# Patient Record
Sex: Male | Born: 1935
Health system: Southern US, Community
[De-identification: ages and names within clinical notes are randomized; demographics above are authoritative.]

## PROBLEM LIST (undated history)

## (undated) DIAGNOSIS — I10 Essential (primary) hypertension: Secondary | ICD-10-CM

## (undated) DIAGNOSIS — E119 Type 2 diabetes mellitus without complications: Secondary | ICD-10-CM

---

## 2009-08-15 ENCOUNTER — Ambulatory Visit: Payer: Self-pay | Admitting: Internal Medicine

## 2012-12-05 LAB — URINALYSIS, COMPLETE
Hyaline Cast: 2
Nitrite: NEGATIVE
Ph: 5 (ref 4.5–8.0)
Protein: NEGATIVE
RBC,UR: 1 /HPF (ref 0–5)
Squamous Epithelial: 1

## 2012-12-05 LAB — PROTIME-INR
INR: 1.1
Prothrombin Time: 14.8 secs — ABNORMAL HIGH (ref 11.5–14.7)

## 2012-12-05 LAB — APTT: Activated PTT: 31.1 secs (ref 23.6–35.9)

## 2012-12-05 LAB — CBC
MCH: 30.6 pg (ref 26.0–34.0)
MCHC: 33.7 g/dL (ref 32.0–36.0)
Platelet: 166 10*3/uL (ref 150–440)
RBC: 2.65 10*6/uL — ABNORMAL LOW (ref 4.40–5.90)
RDW: 14.5 % (ref 11.5–14.5)

## 2012-12-05 LAB — BASIC METABOLIC PANEL
Anion Gap: 2 — ABNORMAL LOW (ref 7–16)
BUN: 67 mg/dL — ABNORMAL HIGH (ref 7–18)
Chloride: 110 mmol/L — ABNORMAL HIGH (ref 98–107)
EGFR (African American): 19 — ABNORMAL LOW
Glucose: 166 mg/dL — ABNORMAL HIGH (ref 65–99)
Osmolality: 297 (ref 275–301)
Sodium: 137 mmol/L (ref 136–145)

## 2012-12-06 ENCOUNTER — Inpatient Hospital Stay: Payer: Self-pay | Admitting: Internal Medicine

## 2012-12-06 LAB — BASIC METABOLIC PANEL WITH GFR
Anion Gap: 4 — ABNORMAL LOW
BUN: 58 mg/dL — ABNORMAL HIGH
Calcium, Total: 8.1 mg/dL — ABNORMAL LOW
Chloride: 113 mmol/L — ABNORMAL HIGH
Co2: 24 mmol/L
Creatinine: 2.39 mg/dL — ABNORMAL HIGH
EGFR (African American): 27 — ABNORMAL LOW
EGFR (Non-African Amer.): 23 — ABNORMAL LOW
Glucose: 146 mg/dL — ABNORMAL HIGH
Osmolality: 300
Potassium: 5.1 mmol/L
Sodium: 141 mmol/L

## 2012-12-06 LAB — CBC
HCT: 25.3 % — ABNORMAL LOW
HGB: 8.6 g/dL — ABNORMAL LOW
MCH: 30.8 pg
MCHC: 33.9 g/dL
MCV: 91 fL
Platelet: 140 x10 3/mm 3 — ABNORMAL LOW
RBC: 2.79 x10 6/mm 3 — ABNORMAL LOW
RDW: 13.9 %
WBC: 9.2 x10 3/mm 3

## 2012-12-06 LAB — HEMATOCRIT
HCT: 26 % — ABNORMAL LOW
HCT: 24.7 % — ABNORMAL LOW (ref 40.0–52.0)

## 2012-12-06 LAB — HEMOGLOBIN
HGB: 8.9 g/dL — ABNORMAL LOW
HGB: 8.5 g/dL — ABNORMAL LOW (ref 13.0–18.0)

## 2012-12-07 LAB — BASIC METABOLIC PANEL
Anion Gap: 2 — ABNORMAL LOW (ref 7–16)
Chloride: 113 mmol/L — ABNORMAL HIGH (ref 98–107)
Co2: 26 mmol/L (ref 21–32)
EGFR (African American): 32 — ABNORMAL LOW
Glucose: 128 mg/dL — ABNORMAL HIGH (ref 65–99)
Osmolality: 294 (ref 275–301)

## 2012-12-07 LAB — MAGNESIUM: Magnesium: 1.5 mg/dL — ABNORMAL LOW

## 2012-12-08 LAB — COMPREHENSIVE METABOLIC PANEL
Albumin: 2.6 g/dL — ABNORMAL LOW (ref 3.4–5.0)
Co2: 25 mmol/L (ref 21–32)
EGFR (African American): 30 — ABNORMAL LOW
EGFR (Non-African Amer.): 26 — ABNORMAL LOW
Osmolality: 290 (ref 275–301)
SGPT (ALT): 13 U/L (ref 12–78)

## 2012-12-08 LAB — HEMOGLOBIN: HGB: 7.8 g/dL — ABNORMAL LOW (ref 13.0–18.0)

## 2013-05-15 ENCOUNTER — Other Ambulatory Visit: Payer: Self-pay | Admitting: Orthopedic Surgery

## 2013-05-15 LAB — BODY FLUID CELL COUNT WITH DIFFERENTIAL
Basophil: 0 %
Eosinophil: 0 %
LYMPHS PCT: 3 %
NUCLEATED CELL COUNT: 28922 /mm3
Neutrophils: 95 %
OTHER CELLS BF: 0 %
Other Mononuclear Cells: 2 %

## 2013-05-15 LAB — SYNOVIAL FLUID, CRYSTAL: CRYSTALS, JOINT FLUID: NONE SEEN

## 2013-05-19 LAB — BODY FLUID CULTURE

## 2014-04-21 ENCOUNTER — Emergency Department: Payer: Self-pay | Admitting: Family Medicine

## 2014-05-25 NOTE — H&P (Signed)
PATIENT NAME:  Jared Patterson, Jared Patterson MR#:  [id number] DATE OF BIRTH:  [birth year]  DATE OF ADMISSION:  12/06/2012  PRIMARY CARE PHYSICIAN: At the [place].   REFERRING PHYSICIAN: Dr. Fanny Bien  CHIEF COMPLAINT:  Dark-colored vomit and black tarry stool.   HISTORY OF PRESENT ILLNESS: The patient is a 81 year old pleasant African American male with past medical history of colon cancer, status post partial colon resection, hypertension, hyperlipidemia, who is presenting to the ER with a chief complaint of three episodes of dark-colored vomit and black tarry stool. Actually, the patient goes to [facility] hospital, but the [place] is under diversion per ER physician and asked the hospitalist team to admit the patient. The patient denies any abdominal pain. No episodes of vomiting were noted in the ER. Heme-positive stool was noticed in the ER. The patient's hemoglobin was at 8 and he has received 1 unit of blood transfusion, while awaiting to [place],  but as the [place] diversion, the hospitalist team is called to admit the patient. During my examination, the patient denies any abdominal pain, resting comfortably. No complaints.   PAST MEDICAL HISTORY: Hypertension, hyperlipidemia, history of colon cancer.   PAST SURGICAL HISTORY: Colon resection.  ALLERGIES:  No known drug allergies.   PSYCHOSOCIAL HISTORY: Lives at home with wife. Smokes half pack a day. Denies alcohol or illicit drug usage.   FAMILY HISTORY: Kidney cancer runs in his family.   REVIEW OF SYSTEMS: CONSTITUTIONAL:  He denies  fever, fatigue.  EYES: Denies blurry vision, glaucoma.  EARS, NOSE, THROAT: No epistaxis, discharge.  RESPIRATION: Denies cough, chronic obstructive pulmonary disease.   CARDIOVASCULAR: No chest pain or palpitations.  GASTROINTESTINAL: Complaining of three episodes of vomiting which was dark color at home. Denies any abdominal pain. Positive melena. Denies any hematemesis.  GENITOURINARY: No dysuria or hematuria. Denies any hernia.   ENDOCRINE: Denies polyuria, nocturia, thyroid problems.  HEMATOLOGIC AND LYMPHATIC: No anemia, easy bruising, bleeding.  INTEGUMENTARY: No acne, rash, lesions. MUSCULOSKELETAL: No joint pain in the neck and back but complaining of right knee pain which is chronic in nature.  NEUROLOGIC:  No vertigo or ataxia.  PSYCHIATRIC: No ADD, OCD.   PHYSICAL EXAMINATION: VITAL SIGNS: Temperature 97.8, pulse 74, respiratory rate to 20, blood pressure 122/53, pulse oximetry 99%.  GENERAL APPEARANCE: Not in acute distress. Moderately built and nourished.  HEENT: Normocephalic, atraumatic. Pupils are equally reacting to light and accommodation. No scleral icterus. No conjunctival injection. No sinus tenderness. Dry mucous membranes.  NECK: Supple. No JVD. No thyromegaly.  LUNGS: Clear to auscultation bilaterally. No accessory muscle usage. No areas of  tenderness on palpation.  CARDIAC: S1, S2 normal. Regular rate and rhythm. No murmurs.  GASTROINTESTINAL: Soft. Bowel sounds are positive in all four quadrants. Nontender, nondistended. No hepatosplenomegaly. No masses felt.  NEUROLOGIC: Awake, alert, oriented x 3. Motor and sensory are grossly intact. Reflexes are 2+.  EXTREMITIES: No edema. No cyanosis. No clubbing.  PSYCHIATRIC:  Normal mood and affect.  MUSCULOSKELETAL: No joint effusion or erythema. Peripheral pulses are 2+.   LABORATORY, DIAGNOSTIC AND RADIOLOGIC DATA:  PT 14.8, INR 1.1, PTT 31.1.   Urinalysis: Leukocyte esterase negative, nitrite negative.   Blood group is B+ antibody negative. WBC 8.4, hemoglobin 8.1, hematocrit 24.1, platelets 156. Glucose 162, BUN 67, creatinine is 3.17, sodium 157, potassium 5.6, chloride 110, CO2 25. Anion gap 2, GFR 19, serum osmolality 297, calcium 8.4.   Abdominal series: Nonobstructive bowel gas pattern.   Chest x-ray: Without evidence of acute cardiopulmonary disease.  ASSESSMENT AND PLAN: A 81 year old African American male presenting to the  Emergency Room with a chief complaint of three episodes of dark-colored vomitus followed by dark tarry stool, will be admitted with the following assessment and plan:  1.  Upper gastrointestinal bleed. We will admit him to Critical Care Unit stepdown. The patient will be nothing by mouth. We will provide him IV fluids, Protonix drip will be continued, which was initiated in the Emergency Room. Gastroenterology consult is placed with Dr. Servando SnareWohl. The patient has received 1 unit of blood transfusion in the Emergency Room. We will monitor hemoglobin and hematocrit closely. We will check this every 6 hours.  2.  Hyperkalemia. One dose of Kayexalate is ordered. We will check morning labs, including potassium and magnesium.  3. Acute kidney injury. Probably the patient might be having underlying renal insufficiency, but we do not have his past medical history. We will provide him IV fluids and avoid nephrotoxins. 4.  Hypertension. Blood pressure is stable at this time. The patient is nothing by mouth. We will hold off on oral medications.  5.  Hyperlipidemia.  6.  History of colon cancer status post partial colon resection.  7.  Gastrointestinal and deep vein thrombosis prophylaxis with SCDs and prophylaxis Protonix.    He is full code and his wife is the medical power of attorney. The diagnosis and plan of care was discussed this with the patient and discussed with his daughter, Gavin PoundDeborah, on the phone. She is agreeable the plan of care.   Total time spent on admission is 45 minutes.    ____________________________ Ramonita LabAruna Khaled Herda, MD ag:cc D: 12/06/2012 01:25:18 ET T: 12/06/2012 01:44:38 ET JOB#: [id number]  cc: Ramonita LabAruna Briceson Broadwater, MD, <Dictator> [facility] Wohl, MD Ramonita LabARUNA Alexie Samson MD ELECTRONICALLY SIGNED 12/18/2012 8:19

## 2014-05-25 NOTE — Consult Note (Signed)
Chief Complaint:  Subjective/Chief Complaint paitent seen for melena/hematemesis.  denies abdominal pain and nausea, no bm or emesis.   VITAL SIGNS/ANCILLARY NOTES: **Vital Signs.:   06-Nov-14 12:00  Vital Signs Type Routine  Temperature Temperature (F) 97.9  Celsius 36.6  Temperature Source oral  Pulse Pulse 68  Respirations Respirations 17  Systolic BP Systolic BP 932  Diastolic BP (mmHg) Diastolic BP (mmHg) 45  Mean BP 63  Oxygen Delivery Room Air/ 21 %   Brief Assessment:  Cardiac Regular   Respiratory clear BS   Gastrointestinal details normal Soft  Nontender  Nondistended  No masses palpable  Bowel sounds normal   Lab Results: Hepatic:  06-Nov-14 04:25   Bilirubin, Total 0.7  Alkaline Phosphatase 64  SGPT (ALT) 13  SGOT (AST) 16  Total Protein, Serum  6.0  Albumin, Serum  2.6  General Ref:  35-TDD-22 02:54   Helicobacter pylori AB. IgG, IgA, IgM ========== TEST NAME ==========  ========= RESULTS =========  = REFERENCE RANGE =  HELICOBACTER P. AB PANEL  H pylori, IgM, IgG, IgA Ab H. pylori, IgG Abs              [   Result Pending       ]                   H. pylori, IgA Abs[   Result Pending       ]                   H. pylori, IgM Abs              [   <9.0 units           ]           0.0-8.9                                                Negative          <9.0                                                Equivocal   9.0 - 11.0                                                Positive         >11.0                                                                      .                This test was developed and its performance                characteristics determined by [facility]. It has not been  cleared or approved by the Food and Drug Administration.                Results of this test are for investigational purposes                only. The result should not be used as a diagnostic              procedure without confirmation of the diagnosis  by                another medically diagnostic product or procedure.               [facility]            No: [id number]           [address], [place], [place] [address]         Lindon Romp, MD         [id number]   Result(s) reported on 08 Dec 2012 at 01:49PM.  Routine Chem:  03-Nov-14 15:31   BUN  67  04-Nov-14 08:47   BUN  58  05-Nov-14 05:56   BUN  43  06-Nov-14 04:25   Uric Acid, Serum  7.6 (Result(s) reported on 08 Dec 2012 at 09:28AM.)  Glucose, Serum  149  BUN  35  Creatinine (comp)  2.14  Sodium, Serum 140  Potassium, Serum 4.9  Chloride, Serum  111  CO2, Serum 25  Calcium (Total), Serum  7.8  Osmolality (calc) 290  eGFR (African American)  30  eGFR (Non-African American)  26 (eGFR values <30mL/min/1.73 m2 may be an indication of chronic kidney disease (CKD). Calculated eGFR is useful in patients with stable renal function. The eGFR calculation will not be reliable in acutely ill patients when serum creatinine is changing rapidly. It is not useful in  patients on dialysis. The eGFR calculation may not be applicable to patients at the low and high extremes of body sizes, pregnant women, and vegetarians.)  Anion Gap  4  Routine Hem:  03-Nov-14 15:31   Hemoglobin (CBC)  8.1  04-Nov-14 02:55   Hemoglobin (CBC)  8.6    08:47   Hemoglobin (CBC)  8.9 (Result(s) reported on 06 Dec 2012 at 09:04AM.)    14:24   Hemoglobin (CBC)  8.5 (Result(s) reported on 06 Dec 2012 at 03:01PM.)  05-Nov-14 05:56   Hemoglobin (CBC)  7.9 (Result(s) reported on 07 Dec 2012 at 07:30AM.)  06-Nov-14 04:25   Hemoglobin (CBC)  7.8 (Result(s) reported on 08 Dec 2012 at 04:44AM.)   Assessment/Plan:  Assessment/Plan:  Assessment 1) melena, hematemesis-severe gastric ulcer.  stable.  no recurrent bleeding since admission.  Continue iv ppi drip through tomorrow am, then change to iv bid.  will need to continue po bid ppi as outpatient .  helicobacter pylori serology final  still pending.  if negative woudl repeat egd versus ugis in about 7-8 weeks.   will advance diet to full liquid, hold at that for a couple days before advancing.   Plan as above   Electronic Signatures: Loistine Simas (MD)  (Signed [phone] 15:11)  Authored: Chief Complaint, VITAL SIGNS/ANCILLARY NOTES, Brief Assessment, Lab Results, Assessment/Plan   Last Updated: 06-Nov-14 15:11 by Loistine Simas (MD)

## 2014-05-25 NOTE — Discharge Summary (Signed)
PATIENT NAME:  Jared Patterson, Jared Patterson MR#:  [id number] DATE OF BIRTH:  11-12-1920  ADDENDUM  Today on the 6th of November 2014, the patient felt satisfactory, did not complain of any significant discomfort except of toe pain. He told rounding physician that [facility] and he is supposed to have angiogram in the future. The patient had no swelling in his lower extremities or any other abnormalities surrounding the skin of the pelvis. He did have, however, diminished pedal pulses, so consultation with vascular surgery was recommended and obtained due to concerns of severe peripheral vascular disease.   Unfortunately, Dr. Wyn Quakerew, who saw the patient in consultation, felt that the patient's kidney function abnormalities preclude him from having angiogram immediately so recommended [facility]   The patient also had uric acid level checked and it was found to be elevated at 7.6. It was felt that the patient's presentation, toe pain, could be due to peripheral vascular disease but most likely due to gout. We did not initiate steroids at this point or colchicine due to recent history of GI bleed.   In regards to gastrointestinal bleed, the patient had no more bleeding and his hemoglobin level remained relatively stable. On the 6th of November 2014, the patient's hemoglobin level was 7.8 as compared to 7.9 yesterday, on the 5t of November 2014. No transfusions were performed. The patient's vital signs remained stable with temperature of 97.9, pulse 70s. Respiration rate was 17 to 26, blood pressure 101 systolic to 118 and 50s diastolic. Oxygen sats were normal at 96% on room air.   The patient is being continued on proton pump inhibitor IV drip at this point. He is to continue those medications for the next 24 hours.  In regards to relative hypertension, we are holding the patient's blood pressure medications. The patient is being continued on IV fluids.    In regards to acute-on-chronic renal failure, initially it was felt to be due to some dehydration as well as anemia. However, the patient's kidney function did not improve significantly. On the 6th of November 2014, the patient's creatinine remains stable at 2.14 with estimated GFR for African Americans of 30 being 2.04 on the 5th of November 2014 with estimated GFR of 32.   It was felt that the patient's creatinine of 2.1 is very likely his baseline. [facility], on the 6th of November 2014; however transfer is still pending.   The patient was seen again by Dr. Marva PandaSkulskie today, on the 6th of November 2014. We recommended to continue IV PPI drip through tomorrow morning and then to change it to IV twice daily and continue b.i.d. PPI as outpatient.   Helicobacter pylori serology was taken; however, results are still pending. If negative, Dr. Marva PandaSkulskie recommended to repeat EGD versus upper GI series in about 7 to 8 weeks. He recommend to advance diet to full liquid and hold full liquid diet for a couple of days before  advancing even further.   TIME SPENT: 40 minutes.    ____________________________ Katharina Caperima Emeri Estill, MD rv:np D: 12/08/2012 18:24:12 ET T: 12/08/2012 19:05:21 ET JOB#: [id number]  cc: Katharina Caperima Clotiel Troop, MD, <Dictator> Saban Heinlen MD ELECTRONICALLY SIGNED 12/21/2012 20:50

## 2014-05-25 NOTE — Discharge Summary (Signed)
PATIENT NAME:  Jared Patterson, Jared Patterson MR#:  [id number] DATE OF BIRTH:  1921-01-09  DATE OF ADMISSION:  12/06/2012 DATE OF DISCHARGE:  12/07/2012   ADMITTING DIAGNOSIS: Upper gastrointestinal bleed.   DISCHARGE DIAGNOSES:   1.  Upper gastrointestinal bleed from gastric ulcer.  2.  Acute posthemorrhagic anemia, status post 1 unit of packed red blood cell transfusion.  3.  Status post esophagogastroduodenoscopy on 10/13/2012 by Dr. Marva Panda, revealing gastric ulcer, duodenitis as well as grade B esophagitis and hiatal hernia. 4.  Hypotension due to gastrointestinal bleed.  5.  Hyperkalemia due to acute on chronic renal failure.  6.  Acute on chronic renal failure, improving with intravenous fluids.  7.  History of hyperlipidemia.  8.  Hypomagnesemia.  9.  History of hypertension. 10.  History of colon cancer, status post colon resection.   DISCHARGE CONDITION: Stable.   DISCHARGE MEDICATIONS:  1.  The patient is to continue Protonix IV drip at 8 mg an hour continuous infusion for the next 24 to 36 hours according to Dr. Reyes Ivan recommendations from today, 12/07/2012. 2.  Lisinopril to be resumed at 10 mg p.o. daily dose when the patient's blood pressure is high.  3.  Slow niacin 750 mg p.o. daily at bedtime.  4.  Primidone 50 mg p.o. at bedtime.  5.  HCTZ 25 mg p.o. daily as the patient's blood pressure rebounds.   The patient is not to take aspirin or any other nonsteroidal anti-inflammatory medications such as Advil, ibuprofen or Motrin unless recommended by primary care physician.   DIET: N.p.o.  ACTIVITY LIMITATIONS: As tolerated.    FOLLOWUP APPOINTMENT: With Dr. Marva Panda in 1 week after discharge as well as [facility] in the next few days after discharge. The patient is being transferred to [facility].   Please refer to discharge summary dictated on 12/06/2012 by Dr. Renae Gloss.   HOSPITAL COURSE: The patient is a 81 year old male with history of hypertension, hyperlipidemia, who  presented to the hospital on 12/06/2012 with upper GI bleed, dark-colored vomit as well as black tarry stool. Please refer to Dr. Rob Hickman admission note on 12/06/2012. On arrival to the hospital, the patient's temperature was 97.8, pulse was 74, respiration rate was 20, blood pressure 122/53. Pulse oximetry was 99% on room air. Physical exam was unremarkable.   The patient's lab data done in the Emergency Room on 12/06/2012 revealed elevation of BUN and creatinine to 67 and 3.12, glucose 166, potassium 5.6. Otherwise, BMP was unremarkable. The patient's estimated GFR for African American would be 19. The patient's liver enzymes were not checked. The patient's CBC: White blood cell count was 8.4, hemoglobin was 8.1 and platelet count was 166. Coagulation panel was unremarkable with pro time of 14.8, INR 1.1 and activated PTT 31.1. Urinalysis was unremarkable. EKG showed normal sinus rhythm at 81 beats per minute, left axis deviation, incomplete right bundle branch block, but no acute ST-T changes were noted. The patient was admitted to the hospital for further evaluation. Because of his wish to be transferred to [facility] medical service, consultation with Dr. Marva Panda, gastroenterologist, was obtained and the patient was initiated on IV Protonix. However, no endoscopic procedures were done immediately after admission. Because the patient was not transferred to [place], upper GI endoscopy was performed on 12/07/2012 by Dr. Marva Panda. It revealed hiatal hernia, LA grade B erosive esophagitis as well as gastric ulcer and duodenitis. Nonbleeding, cratered gastric ulcer with pigmented material below a thin white eschar was found at the incisura.  The lesion was 5 mm in largest dimension. The cardia as well as gastric fundus were normal on retroflexion. Dr. Marva PandaSkulskie recommended Protonix IV drip at 8 mg an hour by continuous infusion for the next 24 to 36 hours and then change to IV 40 mg twice daily before transitioning to 40 mg  twice daily p.o. dosing.   The patient was continued on Protonix IV drip as well as low-rate IV fluids and was initiated on clear liquid diet, but nothing red and no carbonated beverages were recommended by gastroenterologist. [facility] and decided [facility] this patient, to where the patient will be transferred today in stable condition. His vital signs on the day of discharge: Temperature was 98.2. Pulse was ranging from 80s to 100s. Respiratory rate was 17 to 32. Blood pressure 141/50, ranging from 120 to 140s systolic, and O2 sats were 96% on room air at rest.   In regards to acute on chronic renal failure, the patient's creatinine was 3.12 on the day of admission, 12/06/2012; however, it did improve to 2.04 by the day of discharge, 12/07/2012. Potassium level, which was found to be elevated at 5.6 on day of admission, improved to 4.6 with IV hydration. The patient was noted to be hypomagnesemic and magnesium was supplemented. On the day of discharge, 12/07/2012, the patient's hemoglobin level is 7.9 and no active bleeding was noted all the past day. The patient required 1 unit of packed red blood cell transfusion during his stay.  TIME SPENT: 40 minutes.  ____________________________ Katharina Caperima Takako Minckler, MD rv:jcm D: 12/07/2012 15:56:33 ET T: 12/07/2012 17:00:59 ET JOB#: [id number]  cc: Katharina Caperima Greggory Safranek, MD, <Dictator> Wilna Pennie MD ELECTRONICALLY SIGNED 12/21/2012 20:50

## 2014-05-25 NOTE — Consult Note (Signed)
CHIEF COMPLAINT and HISTORY:  Subjective/Chief Complaint feet/toe pain bilaterally   History of Present Illness Patient admitted with hypotension and UGI bleed.  Reports a known history of PAD at [place] and recent ABIs showed significant flow reduction bilaterally, worse on the left per the patient.  Reports pain in his feet and toes at night consistent with ischemic rest pain.  He said this got a little better with smoking cessation a couple months ago, but has been worse since he has been in the hosptial with UGI bleed.  Has CKD with decrease GFR from his baseline.  Complains of pain in both feet.  No open ulcerations present.  No infection in feet   PAST MEDICAL/SURGICAL HISTORY:  Past Medical History:   high chol:    HTN:   ALLERGIES:  Allergies:  No Known Allergies:   HOME MEDICATIONS:  Home Medications: Medication Instructions Status  pantoprazole 8 mg/hr intravenous  Active  lisinopril 10 mg oral tablet 1 tab(s) orally once a day Active  Slo-Niacin 750 mg oral tablet, extended release 1 tab(s) orally once a day (at bedtime) Active  primidone 50 mg oral tablet 1 tab(s) orally once a day (at bedtime) Active  hydrochlorothiazide 25 mg oral tablet 1 tab(s) orally once a day Active   Family and Social History:  Family History Non-Contributory   Social History positive  tobacco (Current within 1 year), negative ETOH   Place of Living Home   Review of Systems:  Subjective/Chief Complaint feet pain, UGI bleed, hypotension   Fever/Chills No   Cough No   Sputum No   Abdominal Pain Yes   Diarrhea No   Constipation No   Nausea/Vomiting No   SOB/DOE No   Chest Pain No   Telemetry Reviewed NSR   Dysuria No   Tolerating PT Yes   Tolerating Diet Yes   Medications/Allergies Reviewed Medications/Allergies reviewed   Physical Exam:  GEN well developed, well nourished   HEENT hearing intact to voice, moist oral mucosa   NECK No masses  trachea midline   RESP  normal resp effort  no use of accessory muscles   CARD regular rate  no JVD   VASCULAR ACCESS none   ABD denies tenderness  normal BS   GU no superpubic tenderness   LYMPH negative neck, negative axillae   EXTR negative cyanosis/clubbing, negative edema, pedal pulses non-palpable bilaterally   SKIN No ulcers, skin turgor good   NEURO cranial nerves intact, motor/sensory function intact   PSYCH alert, A+O to time, place, person   LABS:  Laboratory Results: Hepatic:    06-Nov-14 04:25, Comprehensive Metabolic Panel  Bilirubin, Total 0.7  Alkaline Phosphatase 64  SGPT (ALT) 13  SGOT (AST) 16  Total Protein, Serum 6.0  Albumin, Serum 2.6  Routine BB:    03-Nov-14 17:43, Type and Antibody Screen  ABO Group + Rh Type   B Positive  Antibody Screen NEGATIVE  Result(s) reported on 05 Dec 2012 at 07:09PM.    03-Nov-14 18:44, Crossmatch 2 Units  Crossmatch Unit 1   Transfused  Crossmatch Unit 2 Ready  Result(s) reported on 06 Dec 2012 at [facility].  Cardiology:    03-Nov-14 17:46, ECG  Ventricular Rate 81  Atrial Rate 81  P-R Interval 148  QRS Duration 118  QT 392  QTc 455  P Axis 9  R Axis -30  T Axis 42  ECG interpretation   Normal sinus rhythm  Left axis deviation  Incomplete right bundle branch block  Abnormal ECG  No previous ECGs available  ----------unconfirmed----------  Confirmed by OVERREAD, NOT ([id number]), editor PEARSON, BARBARA ([id number]) on 12/06/2012 1:20:08 PM  ECG   Routine Chem:    03-Nov-14 61:60, Basic Metabolic Panel (w/Total Calcium)  Glucose, Serum 166  BUN 67  Creatinine (comp) 3.12  Sodium, Serum 137  Potassium, Serum 5.6  Chloride, Serum 110  CO2, Serum 25  Calcium (Total), Serum 8.4  Anion Gap 2  Osmolality (calc) 297  eGFR (African American) 19  eGFR (Non-African American) 17  eGFR values <11mL/min/1.73 m2 may be an indication of chronic  kidney disease (CKD).  Calculated eGFR is useful in patients with stable renal function.  The eGFR  calculation will not be reliable in acutely ill patients  when serum creatinine is changing rapidly. It is not useful in   patients on dialysis. The eGFR calculation may not be applicable  to patients at the low and high extremes of body sizes, pregnant  women, and vegetarians.    04-Nov-14 73:71, Basic Metabolic Panel (w/Total Calcium)  Glucose, Serum 146  BUN 58  Creatinine (comp) 2.39  Sodium, Serum 141  Potassium, Serum 5.1  Chloride, Serum 113  CO2, Serum 24  Calcium (Total), Serum 8.1  Anion Gap 4  Osmolality (calc) 300  eGFR (African American) 27  eGFR (Non-African American) 23  eGFR values <46mL/min/1.73 m2 may be an indication of chronic  kidney disease (CKD).  Calculated eGFR is useful in patients with stable renal function.  The eGFR calculation will not be reliable in acutely ill patients  when serum creatinine is changing rapidly. It is not useful in   patients on dialysis. The eGFR calculation may not be applicable  to patients at the low and high extremes of body sizes, pregnant  women, and vegetarians.    05-Nov-14 06:26, Basic Metabolic Panel (w/Total Calcium)  Glucose, Serum 128  BUN 43  Creatinine (comp) 2.04  Sodium, Serum 141  Potassium, Serum 4.6  Chloride, Serum 113  CO2, Serum 26  Calcium (Total), Serum 8.2  Anion Gap 2  Osmolality (calc) 294  eGFR (African American) 32  eGFR (Non-African American) 28  eGFR values <74mL/min/1.73 m2 may be an indication of chronic  kidney disease (CKD).  Calculated eGFR is useful in patients with stable renal function.  The eGFR calculation will not be reliable in acutely ill patients  when serum creatinine is changing rapidly. It is not useful in   patients on dialysis. The eGFR calculation may not be applicable  to patients at the low and high extremes of body sizes, pregnant  women, and vegetarians.    05-Nov-14 05:56, Magnesium, Serum  Magnesium, Serum 1.5  1.8-2.4  THERAPEUTIC RANGE: 4-7 mg/dL  TOXIC: >  10 mg/dL   -----------------------    06-Nov-14 04:25, Comprehensive Metabolic Panel  Glucose, Serum 149  BUN 35  Creatinine (comp) 2.14  Sodium, Serum 140  Potassium, Serum 4.9  Chloride, Serum 111  CO2, Serum 25  Calcium (Total), Serum 7.8  Osmolality (calc) 290  eGFR (African American) 30  eGFR (Non-African American) 26  eGFR values <72mL/min/1.73 m2 may be an indication of chronic  kidney disease (CKD).  Calculated eGFR is useful in patients with stable renal function.  The eGFR calculation will not be reliable in acutely ill patients  when serum creatinine is changing rapidly. It is not useful in   patients on dialysis. The eGFR calculation may not be applicable  to patients at the low and high extremes of  body sizes, pregnant  women, and vegetarians.  Anion Gap 4    06-Nov-14 04:25, Uric Acid, Serum  Uric Acid, Serum 7.6  Result(s) reported on 08 Dec 2012 at 09:28AM.  Routine UA:    03-Nov-14 15:31, Urinalysis  Color (UA) Yellow  Clarity (UA) Hazy  Glucose (UA) Negative  Bilirubin (UA) Negative  Ketones (UA) Negative  Specific Gravity (UA) 1.018  Blood (UA) Negative  pH (UA) 5.0  Protein (UA) Negative  Nitrite (UA) Negative  Leukocyte Esterase (UA) Negative  Result(s) reported on 05 Dec 2012 at 04:05PM.  RBC (UA) 1 /HPF  WBC (UA) 1 /HPF  Bacteria (UA)   NONE SEEN  Epithelial Cells (UA) 1 /HPF  Mucous (UA) PRESENT  Hyaline Cast (UA) 2 /LPF  Result(s) reported on 05 Dec 2012 at 04:05PM.  Routine Coag:    03-Nov-14 17:43, Activated PTT  Activated PTT (APTT) 31.1  A HCT value >55% may artifactually increase the APTT. In one study,  the increase was an average of 19%.  Reference: "Effect on Routine and Special Coagulation Testing Values  of Citrate Anticoagulant Adjustment in Patients with High HCT Values."  American Journal of Clinical Pathology 2006;126:400-405.    03-Nov-14 17:43, Prothrombin Time  Prothrombin 14.8  INR 1.1  INR reference interval  applies to patients on anticoagulant therapy.  A single INR therapeutic range for coumarins is not optimal for all  indications; however, the suggested range for most indications is  2.0 - 3.0.  Exceptions to the INR Reference Range may include: Prosthetic heart  valves, acute myocardial infarction, prevention of myocardial  infarction, and combinations of aspirin and anticoagulant. The need  for a higher or lower target INR must be assessed individually.  Reference: The Pharmacology and Management of the Vitamin K   antagonists: the seventh ACCP Conference on Antithrombotic and  Thrombolytic Therapy. PFXTK.2409 Sept:126 (3suppl): [id number].  A HCT value >55% may artifactually increase the PT.  In one study,   the increase was an average of 25%.  Reference:  "Effect on Routine and Special Coagulation Testing Values  of Citrate Anticoagulant Adjustment in Patients with High HCT Values."  American Journal of Clinical Pathology 2006;126:400-405.  Routine Hem:    03-Nov-14 15:31, Hemogram, Platelet Count  WBC (CBC) 8.4  RBC (CBC) 2.65  Hemoglobin (CBC) 8.1  Hematocrit (CBC) 24.1  Platelet Count (CBC) 166  Result(s) reported on 05 Dec 2012 at 03:56PM.  MCV 91  MCH 30.6  MCHC 33.7  RDW 14.5    04-Nov-14 02:55, Hemogram, Platelet Count  WBC (CBC) 9.2  RBC (CBC) 2.79  Hemoglobin (CBC) 8.6  Hematocrit (CBC) 25.3  Platelet Count (CBC) 140  Result(s) reported on 06 Dec 2012 at 03:13AM.  MCV 91  MCH 30.8  MCHC 33.9  RDW 13.9    04-Nov-14 08:47, Hematocrit  Hematocrit (CBC) 26.0  Result(s) reported on 06 Dec 2012 at 09:04AM.    04-Nov-14 08:47, Hemoglobin  Hemoglobin (CBC) 8.9  Result(s) reported on 06 Dec 2012 at 09:04AM.    04-Nov-14 14:24, Hematocrit  Hematocrit (CBC) 24.7  Result(s) reported on 06 Dec 2012 at 03:01PM.    04-Nov-14 14:24, Hemoglobin  Hemoglobin (CBC) 8.5  Result(s) reported on 06 Dec 2012 at 03:01PM.    05-Nov-14 05:56, Hemoglobin  Hemoglobin (CBC) 7.9   Result(s) reported on 07 Dec 2012 at 07:30AM.    06-Nov-14 04:25, Hemoglobin  Hemoglobin (CBC) 7.8  Result(s) reported on 08 Dec 2012 at 04:44AM.   RADIOLOGY:  Radiology Results: XRay:  03-Nov-14 19:16, Abdomen 3 Way Includes PA Chest  Abdomen 3 Way Includes PA Chest  REASON FOR EXAM:    n/v/d  COMMENTS:       PROCEDURE: DXR - DXR ABDOMEN 3-WAY (INCL PA CXR)  - Dec 05 2012  7:16PM     RESULT: Abdominal series 12/05/2012 comparison 08/15/2009 two-view chest    Findings: There is no evidence of focal infiltrates effusions or edema.   The aorta is tortuous and ectatic.    Air is seen within nondilated loops of large and small bowel. Phleboliths   appreciated within the pelvis. Visualized bony skeleton is unremarkable.    IMPRESSION:  Nonobstructive bowel gas pattern.  2. Chest radiograph without evidence of acute cardiopulmonary disease.    Verified By: Mikki Santee, M.D., MD  LabUnknown:  PACS Image   ASSESSMENT AND PLAN:  Assessment/Admission Diagnosis PAD with rest pain BLE CKD with worse renal function than baseline recent UGI bleed   Plan Would be happy to do angiogram of both LE in a staged fashion to assess PAD and hopefully improve flow.  But would like his renal function better  before doing this.  With hydration and medical management, hopefully this will improve over next several days and we can do first angiogram early next week with renal protective agents.  Will follow   level 4   Electronic Signatures: Algernon Huxley (MD)  (Signed [phone] 12:52)  Authored: Chief Complaint and History, PAST MEDICAL/SURGICAL HISTORY, ALLERGIES, HOME MEDICATIONS, Family and Social History, Review of Systems, Physical Exam, LABS, RADIOLOGY, Assessment and Plan   Last Updated: 06-Nov-14 12:52 by Algernon Huxley (MD)

## 2014-05-25 NOTE — Discharge Summary (Signed)
PATIENT NAME:  Jared Patterson, Jared Patterson MR#:  [id number] DATE OF BIRTH:  08/15/1920  DATE OF ADMISSION:  12/06/2012 DATE OF DISCHARGE:  12/06/2012  PRIMARY CARE PHYSICIAN: [facility].   HOSPITAL COURSE: The patient was admitted to the hospital 12/06/2012. The patient requested transfer to the [facility]. Currently at the time of admission no beds were available. The patient on the waiting list to be transferred to the [facility].   FINAL DIAGNOSES: 1.  Acute hemorrhagic anemia with upper gastrointestinal bleed.  2.  Relative hypotension.  3.  Likely acute renal failure, on chronic kidney disease, with hyperkalemia.  4.  Hyperlipidemia.   CURRENT MEDICATIONS: Include:  1.  Protonix drip 80 mg in 100 mL at 10 mL per hour. 2.  D5 NS at 40 mL per hour. 3.  Zofran 4 mg IV push q. 6 hours p.r.n. nausea and vomiting.  DISCHARGE DIET: The patient is on a clear liquid diet.   REASON FOR ADMISSION: The patient came in with dark color vomit and dark tarry stool, was admitted to the CCU stepdown for upper GI bleed, started on Protonix drip, given 1 unit of blood and serial hemoglobins were ordered. For the hyperkalemia, the patient was given 1 dose of Kayexalate. For the acute renal failure. The patient was given IV fluid hydration. For his relative hypotension, blood pressure medications were held. For his hyperlipidemia, his statin was held.   LABORATORY AND RADIOLOGICAL DATA: Included a glucose of 166, BUN 67, creatinine 3.12, sodium 137, potassium 5.6, chloride 110, CO2 25 and calcium 8.4. White blood cell count 8.4, H and H 8.1 and 24.1, platelet count 166. Urinalysis negative. INR 1.1.   EKG: Normal sinus rhythm, left axis deviation, incomplete right bundle branch block.  Abdomen and chest x-ray: Nonobstructive bowel gas pattern. No evidence of cardiopulmonary disease.   Repeat creatinine 2.39. Repeat potassium 5.1. Last hemoglobin 8.5.   HOSPITAL COURSE PER PROBLEM LIST:  1.  For the patient's  acute hemorrhagic anemia, upper GI bleed, the patient was started on Protonix drip. He was transfused 1 unit of packed red blood cells. Serial hemoglobins were ordered. Case discussed with gastroenterology, Dr. Marva PandaSkulskie. He will consider an endoscopy on the afternoon of November 5th, if the patient is not transferred to the [facility] prior. Can transfuse if needed. Serial hemoglobins will be followed. The patient on Protonix drip.  2.  Relative hypotension. We held the blood pressure medications. Last blood pressure recorded 121/51.  3.  Likely acute renal failure on chronic kidney disease with hyperkalemia. No baseline creatinine to compare. Creatinine did improve from 3.12 to a creatinine of 2.39. Potassium did improve from 5.6 down to 5.1. 4.  For the hyperlipidemia, we are holding the statin.  TIME SPENT ON PATIENT CARE TODAY: 40 minutes.  ____________________________ Herschell Dimesichard J. Renae GlossWieting, MD rjw:sb D: 12/06/2012 16:43:41 ET T: 12/06/2012 17:08:45 ET JOB#: [id number]  cc: Herschell Dimesichard J. Renae GlossWieting, MD, <Dictator> [facility] Salley ScarletICHARD J Izabel Chim MD ELECTRONICALLY SIGNED 12/08/2012 13:33

## 2014-05-25 NOTE — Consult Note (Signed)
Chief Complaint:  Subjective/Chief Complaint seen for coffeeground emesis.hematemesis.  no repeat overnight, no bm, denies nausea or abdominalpain.  denies nsaids except 81 asa.  no anticoagulants/thinners.   VITAL SIGNS/ANCILLARY NOTES: **Vital Signs.:   05-Nov-14 06:00  Vital Signs Type Routine  Temperature Temperature (F) 98.2  Celsius 36.7  Temperature Source oral  Pulse Pulse 82  Respirations Respirations 32  Systolic BP Systolic BP 122  Diastolic BP (mmHg) Diastolic BP (mmHg) 50  Mean BP 74  Pulse Ox % Pulse Ox % 95  Oxygen Delivery Room Air/ 21 %  Pulse Ox Heart Rate 80    11:49  Pulse Pulse 100  Respirations Respirations 31  Systolic BP Systolic BP 141  Diastolic BP (mmHg) Diastolic BP (mmHg) 50  Mean BP 80  Oxygen Delivery Room Air/ 21 %   Brief Assessment:  Cardiac Regular  aortic murmur   Respiratory clear BS   Gastrointestinal details normal Soft  Nontender  Nondistended  No masses palpable  Bowel sounds normal  No rebound tenderness   Lab Results: Routine Chem:  03-Nov-14 15:31   BUN  67  04-Nov-14 08:47   BUN  58  05-Nov-14 05:56   BUN  43  Routine Hem:  03-Nov-14 15:31   Hemoglobin (CBC)  8.1  04-Nov-14 02:55   Hemoglobin (CBC)  8.6    08:47   Hemoglobin (CBC)  8.9 (Result(s) reported on 06 Dec 2012 at 09:04AM.)    14:24   Hemoglobin (CBC)  8.5 (Result(s) reported on 06 Dec 2012 at 03:01PM.)  05-Nov-14 05:56   Hemoglobin (CBC)  7.9 (Result(s) reported on 07 Dec 2012 at 07:30AM.)   Assessment/Plan:  Assessment/Plan:  Assessment 1)  hematemesis, melena.  stable hemodynamically no recurrence.   Plan 1)  EGD today.  I have discussed the risks benefits and complications of egd to include not limited to bleeding infection perforation and sedation and he wishes to proceed. I disucssed Mr Kudo with his daughter and she stated that he does make his own medical decisions.   Electronic Signatures: Barnetta ChapelSkulskie, Bevin Mayall (MD)  (Signed [phone]-Nov-14  13:46)  Authored: Chief Complaint, VITAL SIGNS/ANCILLARY NOTES, Brief Assessment, Lab Results, Assessment/Plan   Last Updated: 05-Nov-14 13:46 by Barnetta ChapelSkulskie, Calistro Rauf (MD)

## 2014-05-25 NOTE — Consult Note (Signed)
Chief Complaint:  Subjective/Chief Complaint Patietn seen and examined, please see full GI consult and brief consult note for full recommendations.  Patient admitted with hematemesis and melena, current s/p tfx of one unit prbc, hemodynamically stable, denies any discomfort, no evidence of recurrent or continued bleeding since admission.  Continue IV ppi and will plan for egd tomorrow pm [facility].  Discussed with Dr Hilton SinclairWeiting.  Continue serial hgb, transfuse as needed, iv ppi.  Following.   Electronic Signatures: Barnetta ChapelSkulskie, Gaby Harney (MD)  (Signed [phone]-Nov-14 18:26)  Authored: Chief Complaint   Last Updated: 04-Nov-14 18:26 by Barnetta ChapelSkulskie, Tanette Chauca (MD)

## 2014-05-25 NOTE — Consult Note (Signed)
Brief Consult Note: Diagnosis: Hematemesis.   Patient was seen by consultant.   Consult note dictated.   Comments: Appreciate consult for 81 y/o [place] man admitted for hematemesis and melena, for evaluation of upper GI bleeding. He has received a unit of PRBC and is currently on a Pantoprazole gtt. States that he recently been started on Prilosec by his PCP for dyspepsia, and has been feeling weaker than usual.  Developed some acid type mild  abdominal pain on saturday- although states this was in the lower abdomen, and followed by nausea and vomiting black material. Also passed some black tarry stools. Last time either of these happened was yesterday morning. Other symptoms have been early satiety, decreased appetite, and a reported weight loss of 5lb over the last year.  Cannot remember if he has ever had an EGD. Does have a history of colon cancer with colectomy about 2236yr ago. States his last colonoscopy was 2-5355yr ago with negative results.States he does not use NSAIDs. Takes 81mg  po ASA daily, no other anticoagulants.  Denies pain and other complaints presently. Hgb has stable, he is hemodynamically stable. There is a potential transfer to the [place] [place] in progress, but no beds available yet according to nsg staff.  Impression and plan: melena/hematemesis- concerning for UGI bleed. Agree with Pantoprazole gtt, recommend following hemoglobin, transfuse prn. Would likely benefit from EGD- will discuss with Dr Willeen NieceSkuslkie, especially in regards to timing and possible transfer..  Electronic Signatures: Vevelyn PatLondon, Christiane H (NP)  (Signed [phone]-Nov-14 14:54)  Authored: Brief Consult Note   Last Updated: 04-Nov-14 14:54 by Keturah BarreLondon, Christiane H (NP)

## 2014-05-25 NOTE — Consult Note (Signed)
PATIENT NAME:  Jared Patterson, [name] MR#:  [id number] DATE OF BIRTH:  08-Feb-1920  DATE OF CONSULTATION:  12/06/2012  REFERRING PHYSICIAN:   CONSULTING PHYSICIAN:  Barnetta ChapelMartin Skulskie, MD / Keturah Barrehristiane H. Senetra Dillin, NP  HISTORY OF PRESENT ILLNESS: We appreciate consult for this 81 year old African American man admitted with hematemesis and melena for evaluation of upper GI bleeding. He has received a unit of packed red blood cells today and is currently on a pantoprazole drip. States he has recently been started on Prilosec by his primary care provider for dyspepsia and has been feeling weaker than usual. Developed some acid type mild abdominal pain on Saturday although states this was in his lower abdomen and followed by nausea and vomiting black material. Also passed some black tarry stools. Last time either of these happened was yesterday morning. Other symptoms associated have been early satiety, decreased appetite and reported weight loss of 5 pounds over the last year. Cannot remember if he ever had an EGD. Does have a history of colon cancer with colectomy about 30 years ago. States his last colonoscopy was 2 to 4 years ago with negative results. States he does not use NSAIDs. Takes 81 mg p.o. aspirin daily. No anticoagulants. Denies pain and other complaints presently. Hemoglobin has been stable. He is hemodynamically stable. There is potential [facility] [place] in progress, but no beds available yet, according to nursing staff.   HOME MEDICATIONS: 1.  ASA 81 mg p.o. daily.  2.  HCTZ 25 mg p.o. daily.  3.  Lisinopril 10 mg p.o. daily.  4.  Prilosec 20 mg p.o. daily.  5.  Primidone 50 mg p.o. daily at bedtime. 6.  Slo-Niacin 750 oral tablet extended release 1 tab at bedtime.   PAST MEDICAL HISTORY: Hypertension, hyperlipidemia, colon cancer with colectomy. States he did not require radiation or chemotherapy.   ALLERGIES: No known drug allergies.  PSYCHOSOCIAL HISTORY: Lives at home with wife,  smokes half pack a day for several years. No alcohol or illicits.   FAMILY HISTORY: Daughter with colon cancer, son with kidney cancer. No peptic ulcer disease or liver disease.   REVIEW OF SYSTEMS:  Ten systems reviewed, otherwise negative other than what is written above with the exception of rare arthralgias.   LABORATORY AND DIAGNOSTICS: Most recent labs: Glucose 146, BUN 58, creatinine 2.39, sodium 141, potassium 5.1, chloride 113, calcium 8.1. WBC 9.2. Most recent hemoglobin 8.9, hematocrit 26, platelet count 140. Red cells are normocytic with normal RDW. PT 14.8. INR 1.1.   Three-way of abdomen with no acute findings.   PHYSICAL EXAMINATION: VITAL SIGNS: Most recent: Temp 98, pulse 68, respiratory rate 18, blood pressure 95/47, SaO2 98% on room air.  GENERAL: Pleasant, well-appearing elderly man resting in bed in no acute distress.  HEENT: Normocephalic, atraumatic. No redness or drainage to the eyes or the nares.  NECK: Supple. No thyromegaly, JVD, lymphadenopathy.  LUNGS: Respirations eupneic. Lungs clear to auscultation and percussion.  CARDIAC: S1, S2. RRR. No MRG. Sinus rhythm on the monitor.  GASTROINTESTINAL: Flat abdomen. Bowel sounds x 4. Soft, nontender. No guarding, rigidity, peritoneal signs or hepatosplenomegaly noted. Does have well healed scar across the lower abdomen.  SKIN: Warm, dry, pink. No erythema, lesion or rash.  EXTREMITIES: MAEW x 4. Strength 5 out of 5. No clubbing.  NEUROLOGIC: Alert and oriented x 3. Cranial nerves II through XII intact. No facial droop.  PSYCHIATRIC: Calm, pleasant, cooperative.   IMPRESSION AND PLAN: Melena and hematemesis concerning  for upper gastrointestinal bleed. Agree with pantoprazole drip. Recommend following hemoglobins, transfusing p.r.n. Would likely benefit from EGD. I have discussed this with Dr. Marva Panda and we will plan to do this tomorrow if clinically feasible.   This services provided by Vevelyn Pat, MS NP-C under  collaborative agreement with Dr. Barnetta Chapel with whom I have discussed this patient in full. Thank you for this consult.  ____________________________ Keturah Barre, NP chl:sb D: 12/06/2012 15:58:16 ET T: 12/06/2012 16:20:26 ET JOB#: [id number]  cc: Keturah Barre, NP, <Dictator> Eustaquio Maize Dian Minahan FNP ELECTRONICALLY SIGNED 01/09/2013 13:04

## 2015-03-31 ENCOUNTER — Emergency Department
Admission: EM | Admit: 2015-03-31 | Discharge: 2015-03-31 | Disposition: A | Discharge status: Home / Self Care | Payer: Medicare Other | Attending: Emergency Medicine | Admitting: Emergency Medicine

## 2015-03-31 ENCOUNTER — Encounter: Payer: Self-pay | Admitting: Emergency Medicine

## 2015-03-31 DIAGNOSIS — I10 Essential (primary) hypertension: Secondary | ICD-10-CM | POA: Insufficient documentation

## 2015-03-31 DIAGNOSIS — E1165 Type 2 diabetes mellitus with hyperglycemia: Secondary | ICD-10-CM | POA: Diagnosis present

## 2015-03-31 DIAGNOSIS — E119 Type 2 diabetes mellitus without complications: Secondary | ICD-10-CM

## 2015-03-31 HISTORY — DX: Essential (primary) hypertension: I10

## 2015-03-31 HISTORY — DX: Type 2 diabetes mellitus without complications: E11.9

## 2015-03-31 LAB — BASIC METABOLIC PANEL
Anion gap: 8 (ref 5–15)
BUN: 43 mg/dL — AB (ref 6–20)
CHLORIDE: 106 mmol/L (ref 101–111)
CO2: 23 mmol/L (ref 22–32)
Calcium: 8.7 mg/dL — ABNORMAL LOW (ref 8.9–10.3)
Creatinine, Ser: 2.38 mg/dL — ABNORMAL HIGH (ref 0.61–1.24)
GFR calc Af Amer: 26 mL/min — ABNORMAL LOW (ref >60)
GFR calc non Af Amer: 22 mL/min — ABNORMAL LOW (ref >60)
Glucose, Bld: 438 mg/dL — ABNORMAL HIGH (ref 65–99)
POTASSIUM: 4.5 mmol/L (ref 3.5–5.1)
SODIUM: 137 mmol/L (ref 135–145)

## 2015-03-31 LAB — CBC
HEMATOCRIT: 36 % — AB (ref 40.0–52.0)
Hemoglobin: 11.6 g/dL — ABNORMAL LOW (ref 13.0–18.0)
MCH: 29.1 pg (ref 26.0–34.0)
MCHC: 32.2 g/dL (ref 32.0–36.0)
MCV: 90.5 fL (ref 80.0–100.0)
Platelets: 144 10*3/uL — ABNORMAL LOW (ref 150–440)
RBC: 3.98 MIL/uL — AB (ref 4.40–5.90)
RDW: 14.4 % (ref 11.5–14.5)
WBC: 4.8 10*3/uL (ref 3.8–10.6)

## 2015-03-31 LAB — URINALYSIS COMPLETE WITH MICROSCOPIC (ARMC ONLY)
BACTERIA UA: NONE SEEN
Bilirubin Urine: NEGATIVE
Glucose, UA: >500 mg/dL — AB
Ketones, ur: NEGATIVE mg/dL
LEUKOCYTES UA: NEGATIVE
Nitrite: NEGATIVE
PH: 5 (ref 5.0–8.0)
Protein, ur: NEGATIVE mg/dL
RBC / HPF: NONE SEEN RBC/hpf (ref 0–5)
SQUAMOUS EPITHELIAL / LPF: NONE SEEN
Specific Gravity, Urine: 1.015 (ref 1.005–1.030)

## 2015-03-31 LAB — GLUCOSE, CAPILLARY
Glucose-Capillary: 416 mg/dL — ABNORMAL HIGH (ref 65–99)
Glucose-Capillary: 417 mg/dL — ABNORMAL HIGH (ref 65–99)
Glucose-Capillary: 212 mg/dL — ABNORMAL HIGH (ref 65–99)

## 2015-03-31 MED ORDER — SODIUM CHLORIDE 0.9 % IV BOLUS (SEPSIS)
1000.0000 mL | Freq: Once | INTRAVENOUS | Status: AC
  Administered 2015-03-31: 1000 mL via INTRAVENOUS

## 2015-03-31 MED ORDER — INSULIN ASPART 100 UNIT/ML ~~LOC~~ SOLN
8.0000 [IU] | Freq: Once | SUBCUTANEOUS | Status: AC
  Administered 2015-03-31: 8 [IU] via INTRAVENOUS
  Filled 2015-03-31: qty 8

## 2015-03-31 NOTE — ED Provider Notes (Signed)
[facility] Emergency Department Provider Note  Time seen: 5:27 PM  I have reviewed the triage vital signs and the nursing notes.   HISTORY  Chief Complaint Hyperglycemia    HPI Jared Patterson is a 82 y.o. male with a past medical history of high blood pressure who presents to the emergency department with elevated blood glucose. According to the patient he has been monitoring his blood pressures over the last few weeks, and is slowly increased. Approximately 5 days agothe patient was started on a new blood pressure medications (he does not recall the name). Patient's physician came to his home to see him on Friday to see how he was responding to the blood pressure medications, the patient noted that he had been urinating frequently, and blood sugar was checked which was 230. The physician wrote the patient for diabetic medications (new onset diabetes), told him to monitor his blood glucose over the weekend and pick up his prescription on Tuesday. Patient states his blood glucose increased greater than 400 today so he came to the emergency department. Patient states he has been checking his blood glucose to this remained in the 200s however he ate cranberry sauce for lunch today and checked it shortly aftergreater than 400 so he came to the emergency department for evaluation. Admits urinary frequency, denies any dysuria, abdominal pain, chest pain, recent cough, congestion or fever.     Past Medical History  Diagnosis Date  . Diabetes mellitus without complication (HCC)   . Hypertension     There are no active problems to display for this patient.   History reviewed. No pertinent past surgical history.  No current outpatient prescriptions on file.  Allergies Review of patient's allergies indicates no known allergies.  History reviewed. No pertinent family history.  Social History Social History  Substance Use Topics  . Smoking status: Never Smoker   .  Smokeless tobacco: None  . Alcohol Use: No    Review of Systems Constitutional: Negative for fever Cardiovascular: Negative for chest pain. Respiratory: Negative for shortness of breath. Gastrointestinal: Negative for abdominal pain Genitourinary: Negative for dysuria. Positive for urinary frequency. Skin: Negative for rash. Neurological: Negative for headache 10-point ROS otherwise negative.  ____________________________________________   PHYSICAL EXAM:  VITAL SIGNS: ED Triage Vitals  Enc Vitals Group     BP 03/31/15 1508 168/73 mmHg     Pulse Rate 03/31/15 1508 83     Resp 03/31/15 1508 18     Temp 03/31/15 1508 98.8 F (37.1 C)     Temp Source 03/31/15 1508 Oral     SpO2 03/31/15 1508 100 %     Weight 03/31/15 1508 185 lb (83.915 kg)     Height 03/31/15 1508  (1.753 m)     Head Cir --      Peak Flow --      Pain Score --      Pain Loc --      Pain Edu? --      Excl. in GC? --     Constitutional: Alert and oriented. Well appearing and in no distress. Eyes: Normal exam ENT   Head: Normocephalic and atraumatic.   Mouth/Throat: Mucous membranes are moist. Cardiovascular: Normal rate, regular rhythm.  Respiratory: Normal respiratory effort without tachypnea nor retractions. Breath sounds are clear  Gastrointestinal: Soft and nontender. No distention.   Musculoskeletal: Nontender with normal range of motion in all extremities.  Neurologic:  Normal speech and language. No gross focal  neurologic deficits  Skin:  Skin is warm, dry and intact.  Psychiatric: Mood and affect are normal. Speech and behavior are normal.   ____________________________________________    INITIAL IMPRESSION / ASSESSMENT AND PLAN / ED COURSE  Pertinent labs & imaging results that were available during my care of the patient were reviewed by me and considered in my medical decision making (see chart for details).  Patient presents with elevated blood glucose of 430. Patient was  recently prescribed a diabetic medication which she cannot pick up until Tuesday when his pharmacy reopens. Patient states after eating cranberry sauce for lunch his blood glucose increased to greater than 420 came to the emergency department for evaluation. Patient denies any associated symptoms besides urinary frequency. Patient's labs show an elevated blood glucose, are otherwise largely at his baseline. We will treat with IV fluids, insulin. I discussed with the patient in depth a diabetic diet, patient is agreeable. I discussed with the patient appear able to get his blood glucose down in the emergency department as long as he can monitor his glucose today and tomorrow and begin his prescription on Tuesday we will discharge home. Patient states he has very close follow-up with his primary care physician. I discussed with the patient that if his blood glucose increases to greater than 350 he needs return to the emergency department for further evaluation. Patient agreeable.  Patient's repeat blood glucose 212. Patient adamantly wishes to be discharged home at this time. We will discharge the patient, discussed with the patient the need to check his blood sugar several times throughout the day and return to the emergency department at the increases greater than 350. Otherwise he is to follow-up with his primary care physician on Monday and begin taking his newly prescribed diabetic medication.  ____________________________________________   FINAL CLINICAL IMPRESSION(S) / ED DIAGNOSES  Diabetes Hyperglycemia   Minna Antis, MD 03/31/15 1919

## 2015-03-31 NOTE — ED Notes (Signed)
Pt checks his sugar daily. [facility] - bs has gone up since then so came to ed

## 2015-03-31 NOTE — ED Notes (Signed)
Pt discharged to home with family member, VSS, afebrile.  Discharged paperwork reviewed with patient and patient instructed to follow-up with PCP tomorrow.  Pt verbalized understanding.

## 2015-03-31 NOTE — Discharge Instructions (Signed)
Please follow-up with her primary care physician tomorrow regarding your recent diagnosis of diabetes. Please fill and begin taking her diabetic medication as prescribed by her primary care physician as soon as possible. Please drink plenty of fluids. Avoid sugary substances. Return to the emergency department for any further episodes of high blood sugar.   Hyperglycemia Hyperglycemia occurs when the glucose (sugar) in your blood is too high. Hyperglycemia can happen for many reasons, but it most often happens to people who do not know they have diabetes or are not managing their diabetes properly.  CAUSES  Whether you have diabetes or not, there are other causes of hyperglycemia. Hyperglycemia can occur when you have diabetes, but it can also occur in other situations that you might not be as aware of, such as: Diabetes  If you have diabetes and are having problems controlling your blood glucose, hyperglycemia could occur because of some of the following reasons:  Not following your meal plan.  Not taking your diabetes medications or not taking it properly.  Exercising less or doing less activity than you normally do.  Being sick. Pre-diabetes  This cannot be ignored. Before people develop Type 2 diabetes, they almost always have "pre-diabetes." This is when your blood glucose levels are higher than normal, but not yet high enough to be diagnosed as diabetes. Research has shown that some long-term damage to the body, especially the heart and circulatory system, may already be occurring during pre-diabetes. If you take action to manage your blood glucose when you have pre-diabetes, you may delay or prevent Type 2 diabetes from developing. Stress  If you have diabetes, you may be "diet" controlled or on oral medications or insulin to control your diabetes. However, you may find that your blood glucose is higher than usual in the hospital whether you have diabetes or not. This is often referred  to as "stress hyperglycemia." Stress can elevate your blood glucose. This happens because of hormones put out by the body during times of stress. If stress has been the cause of your high blood glucose, it can be followed regularly by your caregiver. That way he/she can make sure your hyperglycemia does not continue to get worse or progress to diabetes. Steroids  Steroids are medications that act on the infection fighting system (immune system) to block inflammation or infection. One side effect can be a rise in blood glucose. Most people can produce enough extra insulin to allow for this rise, but for those who cannot, steroids make blood glucose levels go even higher. It is not unusual for steroid treatments to "uncover" diabetes that is developing. It is not always possible to determine if the hyperglycemia will go away after the steroids are stopped. A special blood test called an A1c is sometimes done to determine if your blood glucose was elevated before the steroids were started. SYMPTOMS  Thirsty.  Frequent urination.  Dry mouth.  Blurred vision.  Tired or fatigue.  Weakness.  Sleepy.  Tingling in feet or leg. DIAGNOSIS  Diagnosis is made by monitoring blood glucose in one or all of the following ways:  A1c test. This is a chemical found in your blood.  Fingerstick blood glucose monitoring.  Laboratory results. TREATMENT  First, knowing the cause of the hyperglycemia is important before the hyperglycemia can be treated. Treatment may include, but is not be limited to:  Education.  Change or adjustment in medications.  Change or adjustment in meal plan.  Treatment for an illness, infection, etc.  More frequent blood glucose monitoring.  Change in exercise plan.  Decreasing or stopping steroids.  Lifestyle changes. HOME CARE INSTRUCTIONS   Test your blood glucose as directed.  Exercise regularly. Your caregiver will give you instructions about exercise.  Pre-diabetes or diabetes which comes on with stress is helped by exercising.  Eat wholesome, balanced meals. Eat often and at regular, fixed times. Your caregiver or nutritionist will give you a meal plan to guide your sugar intake.  Being at an ideal weight is important. If needed, losing as little as 10 to 15 pounds may help improve blood glucose levels. SEEK MEDICAL CARE IF:   You have questions about medicine, activity, or diet.  You continue to have symptoms (problems such as increased thirst, urination, or weight gain). SEEK IMMEDIATE MEDICAL CARE IF:   You are vomiting or have diarrhea.  Your breath smells fruity.  You are breathing faster or slower.  You are very sleepy or incoherent.  You have numbness, tingling, or pain in your feet or hands.  You have chest pain.  Your symptoms get worse even though you have been following your caregiver's orders.  If you have any other questions or concerns.   This information is not intended to replace advice given to you by your health care provider. Make sure you discuss any questions you have with your health care provider.   Document Released: 07/15/2000 Document Revised: 04/13/2011 Document Reviewed: 09/25/2014 Elsevier Interactive Patient Education [employer].

## 2016-01-22 IMAGING — CR DG KNEE COMPLETE 4+V*L*
1 series · 4 of 4 positions shown · non-contrast
Comparison: None.

CLINICAL DATA: Knee pain/swelling, no known injury

EXAM:
LEFT KNEE - COMPLETE 4+ VIEW

[Series 1: ap · 0.17mm/px · 4 of 4 slices shown]
[im 1/4]
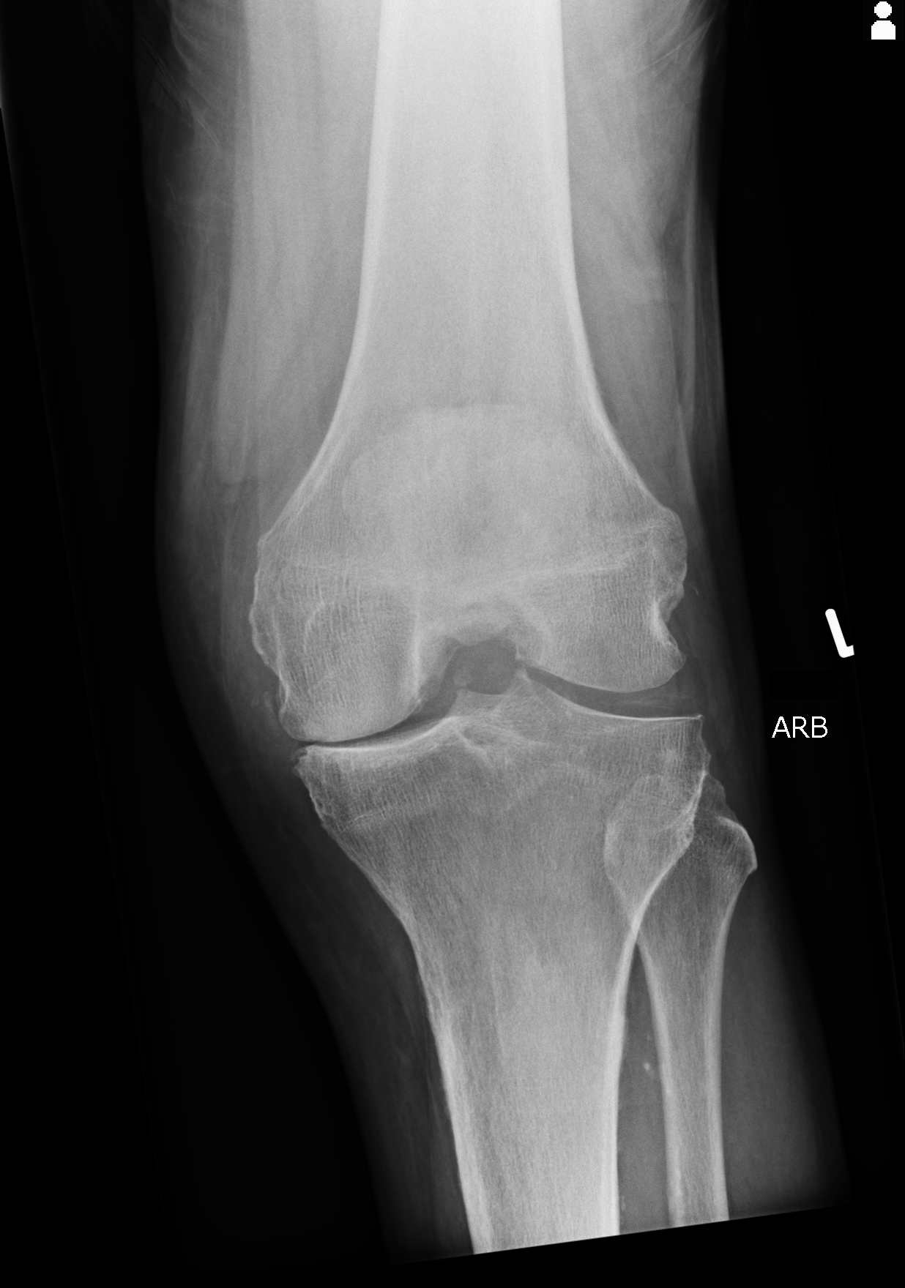
[im 2/4]
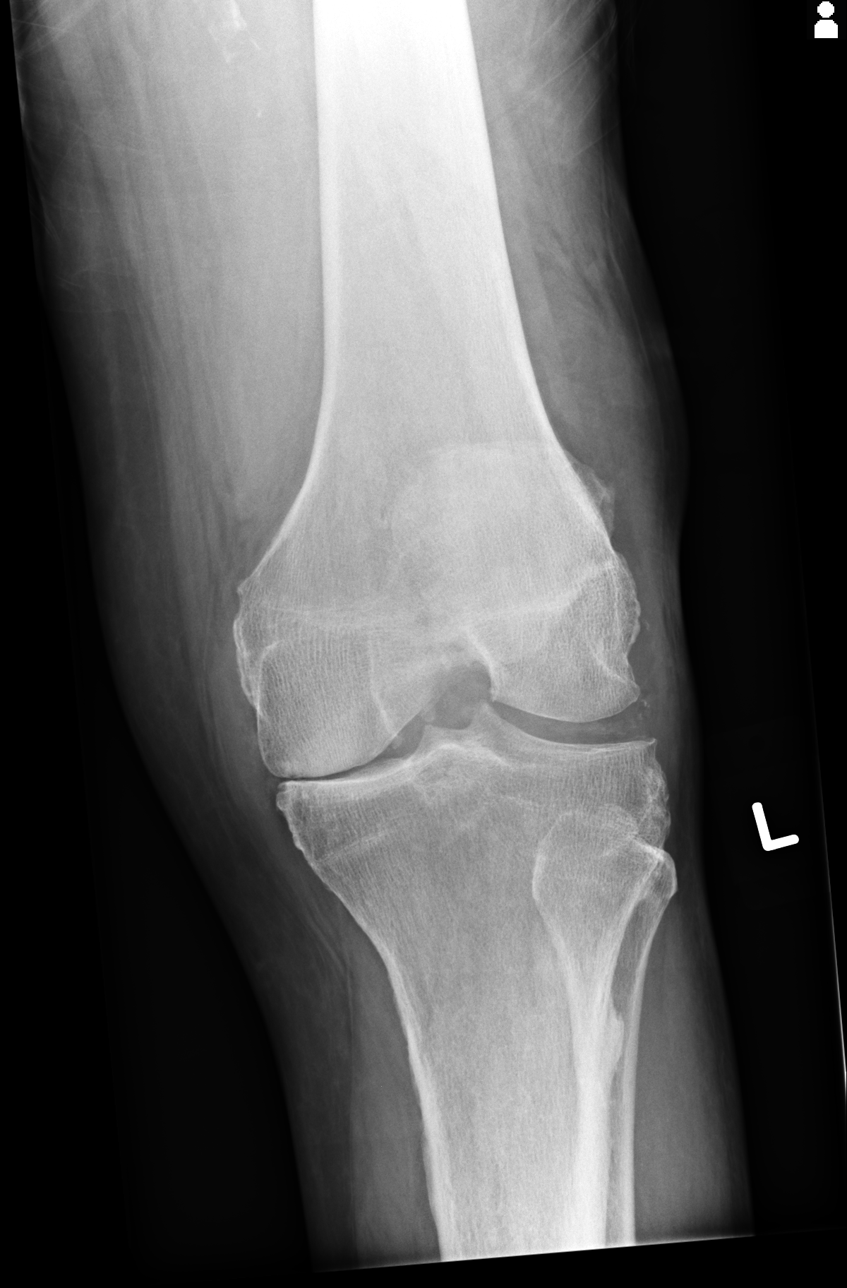
[im 3/4]
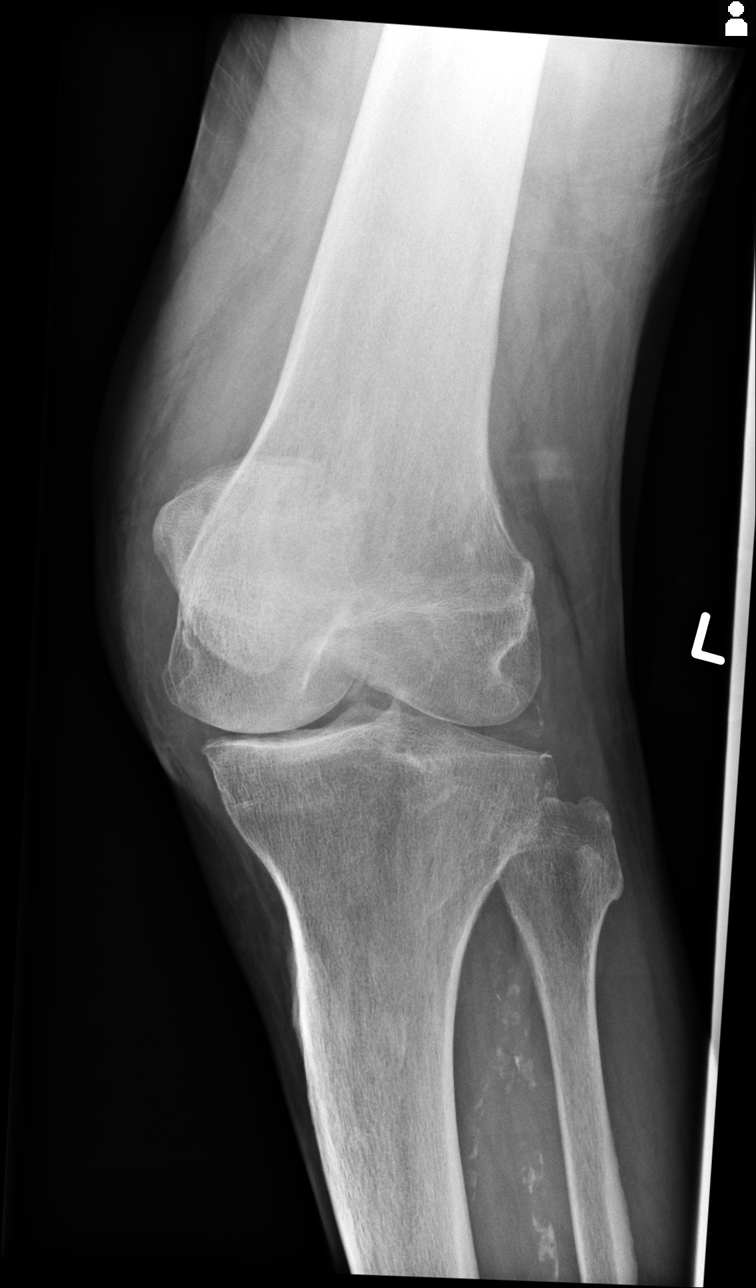
[im 4/4]
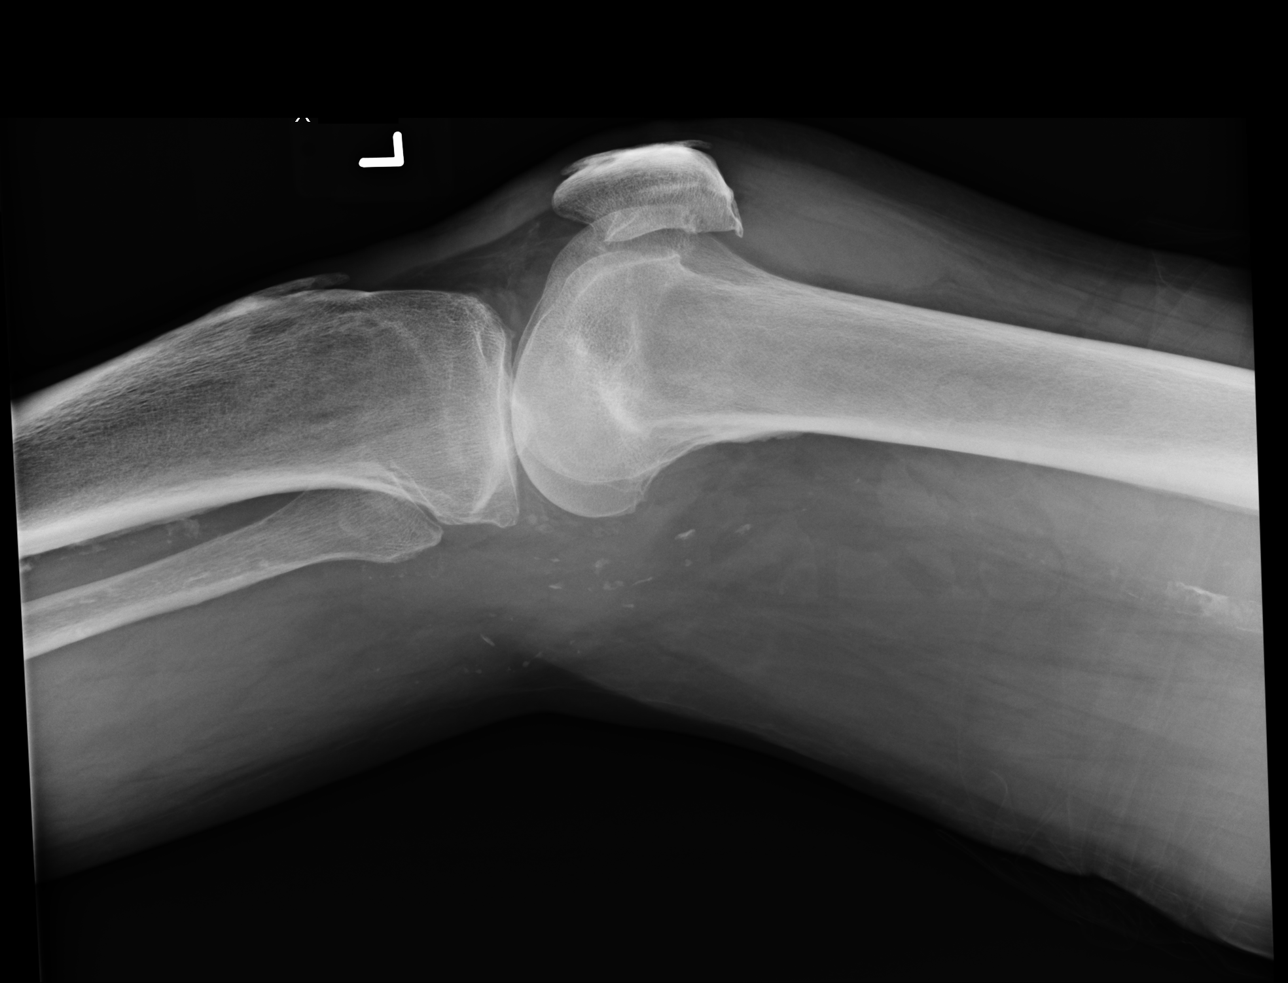

[4 of 4 positions shown; findings below may reference images not displayed]

FINDINGS: No fracture or dislocation is seen.

Moderate degenerative changes, most prominent in the medial and
patellofemoral compartments.

Lateral compartment chondrocalcinosis.

Moderate suprapatellar knee joint effusion.
IMPRESSION: Moderate degenerative changes with chondrocalcinosis.

Suprapatellar knee joint effusion.

## 2016-07-27 ENCOUNTER — Ambulatory Visit (INDEPENDENT_AMBULATORY_CARE_PROVIDER_SITE_OTHER): Payer: No Typology Code available for payment source | Admitting: Podiatry

## 2016-07-27 ENCOUNTER — Encounter: Payer: Self-pay | Admitting: Podiatry

## 2016-07-27 DIAGNOSIS — B351 Tinea unguium: Secondary | ICD-10-CM

## 2016-07-27 DIAGNOSIS — M79676 Pain in unspecified toe(s): Secondary | ICD-10-CM | POA: Diagnosis not present

## 2016-07-27 DIAGNOSIS — E1159 Type 2 diabetes mellitus with other circulatory complications: Secondary | ICD-10-CM | POA: Diagnosis not present

## 2016-07-27 NOTE — Progress Notes (Signed)
   Subjective:    Patient ID: Warden FillersDavid O Polka, male    DOB: Dec 28, 1920, 83 y.o.   MRN: [id number]  HPI this patient presents the office with chief complaint of long thick painful nails especially the first and second toes of the right foot.  He says the nails are painful walking and wearing his shoes. He says he had a nail procedure performed on the right great toe inner border.  He presents the office today for an evaluation and treatment of his long thick nails.  Patient is diabetic.    Review of Systems  Cardiovascular:       Calf pain with walking  Endocrine:       Increased urination  Musculoskeletal: Positive for gait problem.  All other systems reviewed and are negative.      Objective:   Physical Exam GENERAL APPEARANCE: Alert, conversant. Appropriately groomed. No acute distress.  VASCULAR: Pedal pulses are  palpable at  DP  bilateral. PT pulses are not palpable due to ankle swelling. Capillary refill time is immediate to all digits,  Normal temperature gradient.   NEUROLOGIC: sensation is normal to 5.07 monofilament at 5/5 sites bilateral.  Light touch is intact bilateral, Muscle strength normal.  MUSCULOSKELETAL: acceptable muscle strength, tone and stability bilateral.  Intrinsic muscluature intact bilateral.  Rectus appearance of foot and digits noted bilateral. Hammer toe third toe right foot.  DERMATOLOGIC: skin color, texture, and turgor are within normal limits.  No preulcerative lesions or ulcers  are seen, no interdigital maceration noted.  No open lesions present.  Digital nails are asymptomatic. No drainage noted.        Assessment & Plan:  Onychomycosis  B/L    IE  Debride nails  B/L.  RTC 3 months.   Helane GuntherGregory Lakie Mclouth DPM

## 2016-10-26 ENCOUNTER — Encounter: Payer: Self-pay | Admitting: Podiatry

## 2016-10-26 ENCOUNTER — Ambulatory Visit (INDEPENDENT_AMBULATORY_CARE_PROVIDER_SITE_OTHER): Payer: No Typology Code available for payment source | Admitting: Podiatry

## 2016-10-26 DIAGNOSIS — M109 Gout, unspecified: Secondary | ICD-10-CM | POA: Insufficient documentation

## 2016-10-26 DIAGNOSIS — E1159 Type 2 diabetes mellitus with other circulatory complications: Secondary | ICD-10-CM | POA: Diagnosis not present

## 2016-10-26 DIAGNOSIS — N189 Chronic kidney disease, unspecified: Secondary | ICD-10-CM | POA: Insufficient documentation

## 2016-10-26 DIAGNOSIS — M79676 Pain in unspecified toe(s): Secondary | ICD-10-CM

## 2016-10-26 DIAGNOSIS — E119 Type 2 diabetes mellitus without complications: Secondary | ICD-10-CM | POA: Insufficient documentation

## 2016-10-26 DIAGNOSIS — B351 Tinea unguium: Secondary | ICD-10-CM

## 2016-10-26 DIAGNOSIS — I10 Essential (primary) hypertension: Secondary | ICD-10-CM | POA: Insufficient documentation

## 2016-10-26 DIAGNOSIS — L608 Other nail disorders: Secondary | ICD-10-CM

## 2016-10-26 NOTE — Progress Notes (Signed)
Complaint:  Visit Type: Patient returns to my office for continued preventative foot care services. Complaint: Patient states" my nails have grown long and thick and become painful to walk and wear shoes" Patient has been diagnosed with DM with no foot complications. The patient presents for preventative foot care services. No changes to ROS  Podiatric Exam: Vascular: dorsalis pedis and posterior tibial pulses are palpable bilateral. Capillary return is immediate. Temperature gradient is WNL. Skin turgor WNL  Sensorium: Normal Semmes Weinstein monofilament test. Normal tactile sensation bilaterally. Nail Exam: Pt has thick disfigured discolored nails with subungual debris noted bilateral entire nail hallux through fifth toenails Ulcer Exam: There is no evidence of ulcer or pre-ulcerative changes or infection. Pincer hallux nail left. Orthopedic Exam: Muscle tone and strength are WNL. No limitations in general ROM. No crepitus or effusions noted. Foot type and digits show no abnormalities. Bony prominences are unremarkable. Bone spur fifth toe left foot. Skin: No Porokeratosis. No infection or ulcers  Diagnosis:  Onychomycosis, , Pain in right toe, pain in left toes  Treatment & Plan Procedures and Treatment: Consent by patient was obtained for treatment procedures.   Debridement of mycotic and hypertrophic toenails, 1 through 5 bilateral and clearing of subungual debris. No ulceration, no infection noted.  Return Visit-Office Procedure: Patient instructed to return to the office for a follow up visit 10 weeks for continued evaluation and treatment.    Helane Gunther DPM

## 2017-01-04 ENCOUNTER — Encounter: Payer: Self-pay | Admitting: Podiatry

## 2017-01-04 ENCOUNTER — Ambulatory Visit (INDEPENDENT_AMBULATORY_CARE_PROVIDER_SITE_OTHER): Payer: Non-veteran care | Admitting: Podiatry

## 2017-01-04 DIAGNOSIS — B351 Tinea unguium: Secondary | ICD-10-CM | POA: Diagnosis not present

## 2017-01-04 DIAGNOSIS — M79676 Pain in unspecified toe(s): Secondary | ICD-10-CM | POA: Diagnosis not present

## 2017-01-04 DIAGNOSIS — L608 Other nail disorders: Secondary | ICD-10-CM

## 2017-01-04 DIAGNOSIS — E1159 Type 2 diabetes mellitus with other circulatory complications: Secondary | ICD-10-CM | POA: Diagnosis not present

## 2017-01-04 NOTE — Progress Notes (Signed)
Complaint:  Visit Type: Patient returns to my office for continued preventative foot care services. Complaint: Patient states" my nails have grown long and thick and become painful to walk and wear shoes" Patient has been diagnosed with DM with no foot complications. The patient presents for preventative foot care services. No changes to ROS  Podiatric Exam: Vascular: dorsalis pedis and posterior tibial pulses are palpable bilateral. Capillary return is immediate. Temperature gradient is WNL. Skin turgor WNL  Sensorium: Normal Semmes Weinstein monofilament test. Normal tactile sensation bilaterally. Nail Exam: Pt has thick disfigured discolored nails with subungual debris noted bilateral entire nail hallux through fifth toenails Ulcer Exam: There is no evidence of ulcer or pre-ulcerative changes or infection. Pincer hallux nail left. Orthopedic Exam: Muscle tone and strength are WNL. No limitations in general ROM. No crepitus or effusions noted. Hammer toe second  B/L.Marland Kitchen. Bony prominences are unremarkable. Bone spur fifth toe left foot. Skin: No Porokeratosis. No infection or ulcers.  Heloma molle 4th interspace left foot.  Diagnosis:  Onychomycosis, , Pain in right toe, pain in left toes  Treatment & Plan Procedures and Treatment: Consent by patient was obtained for treatment procedures.   Debridement of mycotic and hypertrophic toenails, 1 through 5 bilateral and clearing of subungual debris. No ulceration, no infection noted.  Return Visit-Office Procedure: Patient instructed to return to the office for a follow up visit 10 weeks for continued evaluation and treatment.    Helane GuntherGregory Lijah Bourque DPM

## 2017-03-15 ENCOUNTER — Encounter: Payer: Self-pay | Admitting: Podiatry

## 2017-03-15 ENCOUNTER — Ambulatory Visit (INDEPENDENT_AMBULATORY_CARE_PROVIDER_SITE_OTHER): Payer: Medicare Other | Admitting: Podiatry

## 2017-03-15 DIAGNOSIS — B351 Tinea unguium: Secondary | ICD-10-CM

## 2017-03-15 DIAGNOSIS — M79676 Pain in unspecified toe(s): Secondary | ICD-10-CM | POA: Diagnosis not present

## 2017-03-15 DIAGNOSIS — E1159 Type 2 diabetes mellitus with other circulatory complications: Secondary | ICD-10-CM

## 2017-03-15 DIAGNOSIS — L608 Other nail disorders: Secondary | ICD-10-CM

## 2017-03-15 NOTE — Progress Notes (Signed)
Complaint:  Visit Type: Patient returns to my office for continued preventative foot care services. Complaint: Patient states" my nails have grown long and thick and become painful to walk and wear shoes" Patient has been diagnosed with DM with no foot complications. The patient presents for preventative foot care services. No changes to ROS  Podiatric Exam: Vascular: dorsalis pedis and posterior tibial pulses are palpable bilateral. Capillary return is immediate. Temperature gradient is WNL. Skin turgor WNL  Sensorium: Normal Semmes Weinstein monofilament test. Normal tactile sensation bilaterally. Nail Exam: Pt has thick disfigured discolored nails with subungual debris noted bilateral entire nail hallux through fifth toenails Ulcer Exam: There is no evidence of ulcer or pre-ulcerative changes or infection. Pincer hallux nail left. Orthopedic Exam: Muscle tone and strength are WNL. No limitations in general ROM. No crepitus or effusions noted. Hammer toe second  B/L.. Bony prominences are unremarkable. Bone spur fifth toe left foot. Skin: No Porokeratosis. No infection or ulcers.    Diagnosis:  Onychomycosis, , Pain in right toe, pain in left toes  Treatment & Plan Procedures and Treatment: Consent by patient was obtained for treatment procedures.   Debridement of mycotic and hypertrophic toenails, 1 through 5 bilateral and clearing of subungual debris. No ulceration, no infection noted.  Return Visit-Office Procedure: Patient instructed to return to the office for a follow up visit 10 weeks for continued evaluation and treatment.    Suhan Paci DPM 

## 2017-06-14 ENCOUNTER — Ambulatory Visit: Payer: No Typology Code available for payment source | Admitting: Podiatry

## 2017-06-21 ENCOUNTER — Ambulatory Visit: Payer: No Typology Code available for payment source | Admitting: Podiatry

## 2017-07-05 ENCOUNTER — Encounter: Payer: Self-pay | Admitting: Podiatry

## 2017-07-05 ENCOUNTER — Ambulatory Visit (INDEPENDENT_AMBULATORY_CARE_PROVIDER_SITE_OTHER): Payer: Medicare Other | Admitting: Podiatry

## 2017-07-05 DIAGNOSIS — E1159 Type 2 diabetes mellitus with other circulatory complications: Secondary | ICD-10-CM | POA: Diagnosis not present

## 2017-07-05 DIAGNOSIS — M79676 Pain in unspecified toe(s): Secondary | ICD-10-CM | POA: Diagnosis not present

## 2017-07-05 DIAGNOSIS — B351 Tinea unguium: Secondary | ICD-10-CM

## 2017-07-05 DIAGNOSIS — L608 Other nail disorders: Secondary | ICD-10-CM | POA: Diagnosis not present

## 2017-07-05 NOTE — Progress Notes (Signed)
Complaint:  Visit Type: Patient returns to my office for continued preventative foot care services. Complaint: Patient states" my nails have grown long and thick and become painful to walk and wear shoes" Patient has been diagnosed with DM with no foot complications. The patient presents for preventative foot care services. No changes to ROS  Podiatric Exam: Vascular: dorsalis pedis and posterior tibial pulses are palpable bilateral. Capillary return is immediate. Temperature gradient is WNL. Skin turgor WNL  Sensorium: Normal Semmes Weinstein monofilament test. Normal tactile sensation bilaterally. Nail Exam: Pt has thick disfigured discolored nails with subungual debris noted bilateral entire nail hallux through fifth toenails Ulcer Exam: There is no evidence of ulcer or pre-ulcerative changes or infection. Pincer hallux nail left. Orthopedic Exam: Muscle tone and strength are WNL. No limitations in general ROM. No crepitus or effusions noted. Hammer toe second  B/L.. Bony prominences are unremarkable. Bone spur fifth toe left foot. Skin: No Porokeratosis. No infection or ulcers.    Diagnosis:  Onychomycosis, , Pain in right toe, pain in left toes  Treatment & Plan Procedures and Treatment: Consent by patient was obtained for treatment procedures.   Debridement of mycotic and hypertrophic toenails, 1 through 5 bilateral and clearing of subungual debris. No ulceration, no infection noted.  Return Visit-Office Procedure: Patient instructed to return to the office for a follow up visit 10 weeks for continued evaluation and treatment.    Zubin Pontillo DPM 

## 2017-10-07 ENCOUNTER — Encounter: Payer: Self-pay | Admitting: Podiatry

## 2017-10-07 ENCOUNTER — Ambulatory Visit (INDEPENDENT_AMBULATORY_CARE_PROVIDER_SITE_OTHER): Payer: Medicare Other | Admitting: Podiatry

## 2017-10-07 DIAGNOSIS — L608 Other nail disorders: Secondary | ICD-10-CM

## 2017-10-07 DIAGNOSIS — E1159 Type 2 diabetes mellitus with other circulatory complications: Secondary | ICD-10-CM | POA: Diagnosis not present

## 2017-10-07 DIAGNOSIS — M79676 Pain in unspecified toe(s): Secondary | ICD-10-CM

## 2017-10-07 DIAGNOSIS — B351 Tinea unguium: Secondary | ICD-10-CM | POA: Diagnosis not present

## 2017-10-07 NOTE — Progress Notes (Signed)
Complaint:  Visit Type: Patient returns to my office for continued preventative foot care services. Complaint: Patient states" my nails have grown long and thick and become painful to walk and wear shoes" Patient has been diagnosed with DM with no foot complications. The patient presents for preventative foot care services. No changes to ROS  Podiatric Exam: Vascular: dorsalis pedis and posterior tibial pulses are palpable bilateral. Capillary return is immediate. Temperature gradient is WNL. Skin turgor WNL  Sensorium: Normal Semmes Weinstein monofilament test. Normal tactile sensation bilaterally. Nail Exam: Pt has thick disfigured discolored nails with subungual debris noted bilateral entire nail hallux through fifth toenails Ulcer Exam: There is no evidence of ulcer or pre-ulcerative changes or infection. Pincer hallux nail left. Orthopedic Exam: Muscle tone and strength are WNL. No limitations in general ROM. No crepitus or effusions noted. Hammer toe second  B/L.. Bony prominences are unremarkable. Bone spur fifth toe left foot. Skin: No Porokeratosis. No infection or ulcers.    Diagnosis:  Onychomycosis, , Pain in right toe, pain in left toes  Treatment & Plan Procedures and Treatment: Consent by patient was obtained for treatment procedures.   Debridement of mycotic and hypertrophic toenails, 1 through 5 bilateral and clearing of subungual debris. No ulceration, no infection noted.  Return Visit-Office Procedure: Patient instructed to return to the office for a follow up visit 10 weeks for continued evaluation and treatment.    Elvie Maines DPM 

## 2017-10-28 ENCOUNTER — Telehealth: Payer: Self-pay | Admitting: Podiatry

## 2017-10-28 NOTE — Telephone Encounter (Signed)
This is Everardo Beals calling from the [facility]. Please call me back at [phone].

## 2017-10-28 NOTE — Telephone Encounter (Signed)
I called Jared Patterson back at [facility]. She requested office visit note from date of service  05 September be faxed to her at [phone].

## 2017-12-16 ENCOUNTER — Ambulatory Visit: Payer: Non-veteran care | Admitting: Podiatry

## 2018-01-06 ENCOUNTER — Ambulatory Visit: Payer: Non-veteran care | Admitting: Podiatry

## 2018-08-15 ENCOUNTER — Other Ambulatory Visit: Payer: Self-pay

## 2018-08-15 ENCOUNTER — Ambulatory Visit (INDEPENDENT_AMBULATORY_CARE_PROVIDER_SITE_OTHER): Payer: Medicare Other | Admitting: Podiatry

## 2018-08-15 ENCOUNTER — Encounter: Payer: Self-pay | Admitting: Podiatry

## 2018-08-15 VITALS — Temp 98.7°F

## 2018-08-15 DIAGNOSIS — M79676 Pain in unspecified toe(s): Secondary | ICD-10-CM | POA: Diagnosis not present

## 2018-08-15 DIAGNOSIS — E1159 Type 2 diabetes mellitus with other circulatory complications: Secondary | ICD-10-CM | POA: Diagnosis not present

## 2018-08-15 DIAGNOSIS — B351 Tinea unguium: Secondary | ICD-10-CM | POA: Diagnosis not present

## 2018-08-15 DIAGNOSIS — L608 Other nail disorders: Secondary | ICD-10-CM | POA: Diagnosis not present

## 2018-08-15 NOTE — Progress Notes (Addendum)
Complaint:  Visit Type: Patient returns to my office for continued preventative foot care services. Complaint: Patient states" my nails have grown long and thick and become painful to walk and wear shoes" Patient has been diagnosed with DM with no foot complications. The patient presents for preventative foot care services. No changes to ROS.  Patient relates cutting his toenail right foot which bled.  No evidence of injury.  Daughter says she was responsible for cutting third toenail right foot.    Podiatric Exam: Vascular: dorsalis pedis and posterior tibial pulses are palpable bilateral. Capillary return is immediate. Temperature gradient is WNL. Skin turgor WNL  Sensorium: Normal Semmes Weinstein monofilament test. Normal tactile sensation bilaterally. Nail Exam: Pt has thick disfigured discolored nails with subungual debris noted bilateral entire nail hallux through fifth toenails Ulcer Exam: There is no evidence of ulcer or pre-ulcerative changes or infection. Pincer hallux nail left. Orthopedic Exam: Muscle tone and strength are WNL. No limitations in general ROM. No crepitus or effusions noted. Hammer toe second  B/L.Marland Kitchen Bony prominences are unremarkable. Bone spur fifth toe left foot. Skin: No Porokeratosis. No infection or ulcers.    Diagnosis:  Onychomycosis, , Pain in right toe, pain in left toes  Treatment & Plan Procedures and Treatment: Consent by patient was obtained for treatment procedures.   Debridement of mycotic and hypertrophic toenails, 1 through 5 bilateral and clearing of subungual debris. No ulceration, no infection noted. No evidence of injury or trauma right foot. Return Visit-Office Procedure: Patient instructed to return to the office for a follow up visit 12 weeks for continued evaluation and treatment.    Gardiner Barefoot DPM

## 2018-11-17 ENCOUNTER — Ambulatory Visit (INDEPENDENT_AMBULATORY_CARE_PROVIDER_SITE_OTHER): Payer: Medicare Other | Admitting: Podiatry

## 2018-11-17 ENCOUNTER — Other Ambulatory Visit: Payer: Self-pay

## 2018-11-17 ENCOUNTER — Encounter: Payer: Self-pay | Admitting: Podiatry

## 2018-11-17 DIAGNOSIS — B351 Tinea unguium: Secondary | ICD-10-CM

## 2018-11-17 DIAGNOSIS — E1159 Type 2 diabetes mellitus with other circulatory complications: Secondary | ICD-10-CM | POA: Diagnosis not present

## 2018-11-17 DIAGNOSIS — L608 Other nail disorders: Secondary | ICD-10-CM | POA: Diagnosis not present

## 2018-11-17 DIAGNOSIS — M79676 Pain in unspecified toe(s): Secondary | ICD-10-CM | POA: Diagnosis not present

## 2018-11-17 NOTE — Progress Notes (Signed)
Complaint:  Visit Type: Patient returns to my office for continued preventative foot care services. Complaint: Patient states" my nails have grown long and thick and become painful to walk and wear shoes" Patient has been diagnosed with DM with no foot complications. The patient presents for preventative foot care services. No changes to ROS  Podiatric Exam: Vascular: dorsalis pedis and posterior tibial pulses are palpable bilateral. Capillary return is immediate. Temperature gradient is WNL. Skin turgor WNL  Sensorium: Normal Semmes Weinstein monofilament test. Normal tactile sensation bilaterally. Nail Exam: Pt has thick disfigured discolored nails with subungual debris noted bilateral entire nail hallux through fifth toenails Ulcer Exam: There is no evidence of ulcer or pre-ulcerative changes or infection. Pincer hallux nail left. Orthopedic Exam: Muscle tone and strength are WNL. No limitations in general ROM. No crepitus or effusions noted. Hammer toe second  B/L.Marland Kitchen Bony prominences are unremarkable. Bone spur fifth toe left foot. Skin: No Porokeratosis. No infection or ulcers.    Diagnosis:  Onychomycosis, , Pain in right toe, pain in left toes  Treatment & Plan Procedures and Treatment: Consent by patient was obtained for treatment procedures.   Debridement of mycotic and hypertrophic toenails, 1 through 5 bilateral and clearing of subungual debris. No ulceration, no infection noted.  Return Visit-Office Procedure: Patient instructed to return to the office for a follow up visit 10 weeks for continued evaluation and treatment.    Gardiner Barefoot DPM

## 2019-03-16 ENCOUNTER — Encounter: Payer: Self-pay | Admitting: Podiatry

## 2019-03-16 ENCOUNTER — Other Ambulatory Visit: Payer: Self-pay

## 2019-03-16 ENCOUNTER — Ambulatory Visit: Payer: Medicare HMO | Admitting: Podiatry

## 2019-03-16 DIAGNOSIS — B351 Tinea unguium: Secondary | ICD-10-CM | POA: Diagnosis not present

## 2019-03-16 DIAGNOSIS — E1159 Type 2 diabetes mellitus with other circulatory complications: Secondary | ICD-10-CM | POA: Diagnosis not present

## 2019-03-16 DIAGNOSIS — L608 Other nail disorders: Secondary | ICD-10-CM | POA: Diagnosis not present

## 2019-03-16 DIAGNOSIS — M79676 Pain in unspecified toe(s): Secondary | ICD-10-CM | POA: Diagnosis not present

## 2019-03-16 NOTE — Progress Notes (Signed)
Complaint:  Visit Type: Patient returns to my office for continued preventative foot care services. Complaint: Patient states" my nails have grown long and thick and become painful to walk and wear shoes" Patient has been diagnosed with DM with no foot complications. The patient presents for preventative foot care services. No changes to ROS  Podiatric Exam: Vascular: dorsalis pedis and posterior tibial pulses are palpable bilateral. Capillary return is immediate. Temperature gradient is WNL. Skin turgor WNL  Sensorium: Normal Semmes Weinstein monofilament test. Normal tactile sensation bilaterally. Nail Exam: Pt has thick disfigured discolored nails with subungual debris noted bilateral entire nail hallux through fifth toenails Ulcer Exam: There is no evidence of ulcer or pre-ulcerative changes or infection. Pincer hallux nail left. Orthopedic Exam: Muscle tone and strength are WNL. No limitations in general ROM. No crepitus or effusions noted. Hammer toe second  B/L.Marland Kitchen Bony prominences are unremarkable. Bone spur fifth toe left foot. Skin: No Porokeratosis. No infection or ulcers.    Diagnosis:  Onychomycosis, , Pain in right toe, pain in left toes  Treatment & Plan Procedures and Treatment: Consent by patient was obtained for treatment procedures.   Debridement of mycotic and hypertrophic toenails, 1 through 5 bilateral and clearing of subungual debris. No ulceration, no infection noted. Padding dispensed between second/third toe  right foot. Return Visit-Office Procedure: Patient instructed to return to the office for a follow up visit 3 months for continued evaluation and treatment.    Helane Gunther DPM

## 2019-07-20 ENCOUNTER — Ambulatory Visit: Payer: Medicare HMO | Admitting: Podiatry

## 2019-12-04 DEATH — deceased
